# Patient Record
Sex: Male | Born: 1955 | Race: White | Hispanic: No | Marital: Married | Smoking: Current some day smoker
Health system: Southern US, Community
[De-identification: ages and names within clinical notes are randomized; demographics above are authoritative.]

## PROBLEM LIST (undated history)

## (undated) DIAGNOSIS — C61 Malignant neoplasm of prostate: Secondary | ICD-10-CM

## (undated) DIAGNOSIS — L0390 Cellulitis, unspecified: Secondary | ICD-10-CM

## (undated) DIAGNOSIS — N529 Male erectile dysfunction, unspecified: Secondary | ICD-10-CM

## (undated) DIAGNOSIS — H01009 Unspecified blepharitis unspecified eye, unspecified eyelid: Secondary | ICD-10-CM

## (undated) DIAGNOSIS — IMO0002 Reserved for concepts with insufficient information to code with codable children: Secondary | ICD-10-CM

## (undated) DIAGNOSIS — E785 Hyperlipidemia, unspecified: Secondary | ICD-10-CM

## (undated) DIAGNOSIS — G4730 Sleep apnea, unspecified: Secondary | ICD-10-CM

## (undated) HISTORY — DX: Reserved for concepts with insufficient information to code with codable children: IMO0002

## (undated) HISTORY — DX: Unspecified blepharitis unspecified eye, unspecified eyelid: H01.009

## (undated) HISTORY — DX: Sleep apnea, unspecified: G47.30

## (undated) HISTORY — DX: Cellulitis, unspecified: L03.90

## (undated) HISTORY — DX: Hyperlipidemia, unspecified: E78.5

## (undated) HISTORY — DX: Male erectile dysfunction, unspecified: N52.9

## (undated) HISTORY — PX: BACK SURGERY: SHX140

## (undated) HISTORY — DX: Malignant neoplasm of prostate: C61

## (undated) HISTORY — PX: PROSTATECTOMY: SHX69

## (undated) HISTORY — PX: TONSILLECTOMY: SHX5217

## (undated) HISTORY — PX: LUMBAR DISC SURGERY: SHX700

---

## 2000-04-11 ENCOUNTER — Encounter: Payer: Self-pay | Admitting: Family Medicine

## 2000-04-11 ENCOUNTER — Encounter: Admission: RE | Admit: 2000-04-11 | Discharge: 2000-04-11 | Payer: Self-pay | Admitting: Family Medicine

## 2000-04-29 ENCOUNTER — Ambulatory Visit (HOSPITAL_COMMUNITY): Admission: RE | Admit: 2000-04-29 | Discharge: 2000-04-30 | Payer: Self-pay | Admitting: Neurosurgery

## 2000-04-29 ENCOUNTER — Encounter: Payer: Self-pay | Admitting: Neurosurgery

## 2004-12-16 ENCOUNTER — Encounter: Payer: Self-pay | Admitting: Internal Medicine

## 2005-04-05 LAB — HM COLONOSCOPY

## 2005-12-31 ENCOUNTER — Encounter: Payer: Self-pay | Admitting: Internal Medicine

## 2007-01-17 ENCOUNTER — Encounter: Payer: Self-pay | Admitting: Internal Medicine

## 2007-02-07 ENCOUNTER — Encounter: Payer: Self-pay | Admitting: Internal Medicine

## 2007-02-15 ENCOUNTER — Encounter: Payer: Self-pay | Admitting: Internal Medicine

## 2007-02-22 ENCOUNTER — Encounter: Payer: Self-pay | Admitting: Internal Medicine

## 2007-02-27 ENCOUNTER — Encounter: Payer: Self-pay | Admitting: Internal Medicine

## 2007-02-27 ENCOUNTER — Encounter: Admission: RE | Admit: 2007-02-27 | Discharge: 2007-02-27 | Payer: Self-pay | Admitting: Urology

## 2007-02-28 ENCOUNTER — Encounter: Payer: Self-pay | Admitting: Internal Medicine

## 2007-04-04 ENCOUNTER — Encounter: Payer: Self-pay | Admitting: Internal Medicine

## 2007-04-05 ENCOUNTER — Encounter: Payer: Self-pay | Admitting: Internal Medicine

## 2007-08-11 ENCOUNTER — Ambulatory Visit: Payer: Self-pay | Admitting: Internal Medicine

## 2007-08-11 DIAGNOSIS — H0260 Xanthelasma of unspecified eye, unspecified eyelid: Secondary | ICD-10-CM | POA: Insufficient documentation

## 2007-08-11 DIAGNOSIS — E785 Hyperlipidemia, unspecified: Secondary | ICD-10-CM | POA: Insufficient documentation

## 2007-08-11 DIAGNOSIS — Z8546 Personal history of malignant neoplasm of prostate: Secondary | ICD-10-CM

## 2007-08-22 ENCOUNTER — Ambulatory Visit: Payer: Self-pay | Admitting: Internal Medicine

## 2007-08-22 LAB — CONVERTED CEMR LAB
ALT: 33 units/L (ref 0–53)
AST: 26 units/L (ref 0–37)
Albumin ELP: 62.9 % (ref 55.8–66.1)
Albumin: 4.1 g/dL (ref 3.5–5.2)
Alkaline Phosphatase: 64 units/L (ref 39–117)
Alpha-1-Globulin: 3.5 % (ref 2.9–4.9)
Alpha-2-Globulin: 8.6 % (ref 7.1–11.8)
Anti Nuclear Antibody(ANA): NEGATIVE
Basophils Absolute: 0 10*3/uL (ref 0.0–0.1)
Basophils Absolute: 0 10*3/uL (ref 0.0–0.1)
Basophils Relative: 0.7 % (ref 0.0–1.0)
Basophils Relative: 1 % (ref 0–1)
Beta Globulin: 6.3 % (ref 4.7–7.2)
Bilirubin, Direct: 0.1 mg/dL (ref 0.0–0.3)
CRP, High Sensitivity: 1 (ref 0.00–5.00)
Cholesterol: 233 mg/dL (ref 0–200)
Direct LDL: 159.1 mg/dL
Eosinophils Absolute: 0.3 10*3/uL (ref 0.0–0.7)
Eosinophils Absolute: 0.4 10*3/uL (ref 0.0–0.7)
Eosinophils Relative: 6.2 % — ABNORMAL HIGH (ref 0.0–5.0)
Eosinophils Relative: 7 % — ABNORMAL HIGH (ref 0–5)
Gamma Globulin: 13.9 % (ref 11.1–18.8)
HCT: 46.6 % (ref 39.0–52.0)
HCT: 47.5 % (ref 39.0–52.0)
HDL: 33.5 mg/dL — ABNORMAL LOW (ref >39.0)
Hemoglobin: 15.9 g/dL (ref 13.0–17.0)
Hemoglobin: 16 g/dL (ref 13.0–17.0)
LDH: 144 units/L (ref 94–250)
Lymphocytes Relative: 30.6 % (ref 12.0–46.0)
Lymphocytes Relative: 32 % (ref 12–46)
Lymphs Abs: 1.7 10*3/uL (ref 0.7–4.0)
MCHC: 33.7 g/dL (ref 30.0–36.0)
MCHC: 34.1 g/dL (ref 30.0–36.0)
MCV: 90.4 fL (ref 78.0–100.0)
MCV: 91.9 fL (ref 78.0–100.0)
Monocytes Absolute: 0.5 10*3/uL (ref 0.1–1.0)
Monocytes Absolute: 0.6 10*3/uL (ref 0.1–1.0)
Monocytes Relative: 10 % (ref 3–12)
Monocytes Relative: 9.7 % (ref 3.0–12.0)
Neutro Abs: 2.7 10*3/uL (ref 1.4–7.7)
Neutro Abs: 2.7 10*3/uL (ref 1.7–7.7)
Neutrophils Relative %: 51 % (ref 43–77)
Neutrophils Relative %: 52.8 % (ref 43.0–77.0)
Platelets: 278 10*3/uL (ref 150–400)
Platelets: 295 10*3/uL (ref 150–400)
RBC: 5.16 M/uL (ref 4.22–5.81)
RBC: 5.17 M/uL (ref 4.22–5.81)
RDW: 13 % (ref 11.5–14.6)
RDW: 14 % (ref 11.5–15.5)
Total Bilirubin: 1.1 mg/dL (ref 0.3–1.2)
Total CHOL/HDL Ratio: 7
Total Protein, Serum Electrophoresis: 7.4 g/dL (ref 6.0–8.3)
Total Protein: 7.3 g/dL (ref 6.0–8.3)
Triglycerides: 146 mg/dL (ref 0–149)
VLDL: 29 mg/dL (ref 0–40)
WBC: 5 10*3/uL (ref 4.5–10.5)
WBC: 5.4 10*3/uL (ref 4.0–10.5)

## 2007-08-28 ENCOUNTER — Encounter: Payer: Self-pay | Admitting: Internal Medicine

## 2007-10-16 ENCOUNTER — Telehealth: Payer: Self-pay | Admitting: Internal Medicine

## 2007-10-19 ENCOUNTER — Ambulatory Visit: Payer: Self-pay | Admitting: Internal Medicine

## 2007-10-19 LAB — CONVERTED CEMR LAB: PSA: 0 ng/mL — ABNORMAL LOW (ref 0.10–4.00)

## 2007-10-20 ENCOUNTER — Telehealth: Payer: Self-pay | Admitting: Internal Medicine

## 2008-01-15 ENCOUNTER — Telehealth: Payer: Self-pay | Admitting: Internal Medicine

## 2008-01-16 ENCOUNTER — Ambulatory Visit: Payer: Self-pay | Admitting: Internal Medicine

## 2008-01-16 LAB — CONVERTED CEMR LAB: PSA: 0 ng/mL — ABNORMAL LOW (ref 0.10–4.00)

## 2008-01-21 ENCOUNTER — Encounter: Payer: Self-pay | Admitting: Internal Medicine

## 2008-02-06 ENCOUNTER — Ambulatory Visit: Payer: Self-pay | Admitting: Internal Medicine

## 2008-02-06 LAB — CONVERTED CEMR LAB
ALT: 45 units/L (ref 0–53)
AST: 29 units/L (ref 0–37)
Albumin: 4.1 g/dL (ref 3.5–5.2)
Alkaline Phosphatase: 57 units/L (ref 39–117)
BUN: 10 mg/dL (ref 6–23)
Basophils Absolute: 0 10*3/uL (ref 0.0–0.1)
Basophils Relative: 0.1 % (ref 0.0–3.0)
Bilirubin Urine: NEGATIVE
Bilirubin, Direct: 0.2 mg/dL (ref 0.0–0.3)
CO2: 28 meq/L (ref 19–32)
Calcium: 8.9 mg/dL (ref 8.4–10.5)
Chloride: 105 meq/L (ref 96–112)
Cholesterol: 216 mg/dL (ref 0–200)
Creatinine, Ser: 0.7 mg/dL (ref 0.4–1.5)
Direct LDL: 142.4 mg/dL
Eosinophils Absolute: 0.2 10*3/uL (ref 0.0–0.7)
Eosinophils Relative: 4.1 % (ref 0.0–5.0)
GFR calc Af Amer: 152 mL/min
GFR calc non Af Amer: 126 mL/min
Glucose, Bld: 92 mg/dL (ref 70–99)
HCT: 44.4 % (ref 39.0–52.0)
HDL: 39.1 mg/dL (ref >39.0)
Hemoglobin, Urine: NEGATIVE
Hemoglobin: 15.5 g/dL (ref 13.0–17.0)
Ketones, ur: NEGATIVE mg/dL
Leukocytes, UA: NEGATIVE
Lymphocytes Relative: 30.4 % (ref 12.0–46.0)
MCHC: 34.8 g/dL (ref 30.0–36.0)
MCV: 92 fL (ref 78.0–100.0)
Monocytes Absolute: 0.6 10*3/uL (ref 0.1–1.0)
Monocytes Relative: 11.5 % (ref 3.0–12.0)
Neutro Abs: 3.1 10*3/uL (ref 1.4–7.7)
Neutrophils Relative %: 53.9 % (ref 43.0–77.0)
Nitrite: NEGATIVE
PSA: 0.03 ng/mL — ABNORMAL LOW (ref 0.10–4.00)
Platelets: 254 10*3/uL (ref 150–400)
Potassium: 4.2 meq/L (ref 3.5–5.1)
RBC: 4.83 M/uL (ref 4.22–5.81)
RDW: 12.2 % (ref 11.5–14.6)
Sodium: 140 meq/L (ref 135–145)
Specific Gravity, Urine: 1.015 (ref 1.000–1.03)
TSH: 0.78 microintl units/mL (ref 0.35–5.50)
Total Bilirubin: 1.1 mg/dL (ref 0.3–1.2)
Total CHOL/HDL Ratio: 5.5
Total Protein: 7.2 g/dL (ref 6.0–8.3)
Triglycerides: 134 mg/dL (ref 0–149)
Urine Glucose: NEGATIVE mg/dL
Urobilinogen, UA: 0.2 (ref 0.0–1.0)
VLDL: 27 mg/dL (ref 0–40)
WBC: 5.6 10*3/uL (ref 4.5–10.5)
pH: 7.5 (ref 5.0–8.0)

## 2008-02-08 ENCOUNTER — Ambulatory Visit: Payer: Self-pay | Admitting: Internal Medicine

## 2008-04-19 ENCOUNTER — Encounter: Payer: Self-pay | Admitting: Internal Medicine

## 2008-04-25 ENCOUNTER — Ambulatory Visit: Payer: Self-pay | Admitting: Internal Medicine

## 2008-04-25 LAB — CONVERTED CEMR LAB: PSA: 0 ng/mL — ABNORMAL LOW (ref 0.10–4.00)

## 2008-04-28 ENCOUNTER — Telehealth: Payer: Self-pay | Admitting: Internal Medicine

## 2008-05-01 ENCOUNTER — Encounter: Payer: Self-pay | Admitting: Internal Medicine

## 2008-07-24 ENCOUNTER — Ambulatory Visit: Payer: Self-pay | Admitting: Internal Medicine

## 2008-07-24 LAB — CONVERTED CEMR LAB: PSA: 0 ng/mL — ABNORMAL LOW (ref 0.10–4.00)

## 2008-07-28 ENCOUNTER — Telehealth: Payer: Self-pay | Admitting: Internal Medicine

## 2008-10-23 ENCOUNTER — Ambulatory Visit: Payer: Self-pay | Admitting: Internal Medicine

## 2008-10-30 LAB — CONVERTED CEMR LAB: PSA: 0 ng/mL — ABNORMAL LOW (ref 0.10–4.00)

## 2009-02-11 ENCOUNTER — Ambulatory Visit: Payer: Self-pay | Admitting: Internal Medicine

## 2009-02-17 ENCOUNTER — Telehealth: Payer: Self-pay | Admitting: Internal Medicine

## 2009-04-23 ENCOUNTER — Ambulatory Visit: Payer: Self-pay | Admitting: Internal Medicine

## 2009-04-23 LAB — CONVERTED CEMR LAB: PSA: 0.01 ng/mL — ABNORMAL LOW (ref 0.10–4.00)

## 2009-04-24 ENCOUNTER — Encounter: Payer: Self-pay | Admitting: Internal Medicine

## 2009-08-04 ENCOUNTER — Telehealth: Payer: Self-pay | Admitting: Internal Medicine

## 2009-10-23 ENCOUNTER — Ambulatory Visit: Payer: Self-pay | Admitting: Internal Medicine

## 2009-10-23 LAB — CONVERTED CEMR LAB: PSA: 0.01 ng/mL — ABNORMAL LOW (ref 0.10–4.00)

## 2009-10-26 ENCOUNTER — Telehealth: Payer: Self-pay | Admitting: Internal Medicine

## 2010-02-19 ENCOUNTER — Ambulatory Visit: Payer: Self-pay | Admitting: Internal Medicine

## 2010-02-19 LAB — CONVERTED CEMR LAB
ALT: 42 units/L (ref 0–53)
BUN: 13 mg/dL (ref 6–23)
Basophils Absolute: 0 10*3/uL (ref 0.0–0.1)
Chloride: 102 meq/L (ref 96–112)
Cholesterol: 226 mg/dL — ABNORMAL HIGH (ref 0–200)
Eosinophils Absolute: 0.1 10*3/uL (ref 0.0–0.7)
Glucose, Bld: 91 mg/dL (ref 70–99)
HCT: 43.9 % (ref 39.0–52.0)
Leukocytes, UA: NEGATIVE
Lymphs Abs: 1.1 10*3/uL (ref 0.7–4.0)
MCHC: 34.4 g/dL (ref 30.0–36.0)
MCV: 95.9 fL (ref 78.0–100.0)
Monocytes Absolute: 0.7 10*3/uL (ref 0.1–1.0)
Nitrite: NEGATIVE
Platelets: 265 10*3/uL (ref 150.0–400.0)
Potassium: 4.9 meq/L (ref 3.5–5.1)
RDW: 13.8 % (ref 11.5–14.6)
Specific Gravity, Urine: >=1.03 (ref 1.000–1.030)
TSH: 0.56 microintl units/mL (ref 0.35–5.50)
Total Bilirubin: 1.4 mg/dL — ABNORMAL HIGH (ref 0.3–1.2)
pH: 6 (ref 5.0–8.0)

## 2010-02-23 ENCOUNTER — Encounter: Payer: Self-pay | Admitting: Internal Medicine

## 2010-02-23 ENCOUNTER — Ambulatory Visit: Payer: Self-pay | Admitting: Internal Medicine

## 2010-03-30 ENCOUNTER — Ambulatory Visit: Payer: Self-pay | Admitting: Internal Medicine

## 2010-04-27 ENCOUNTER — Encounter: Payer: Self-pay | Admitting: Chiropractic Medicine

## 2010-05-05 NOTE — Progress Notes (Signed)
  Phone Note Outgoing Call   Reason for Call: Discuss lab or test results Summary of Call: please call patient: PSA 0.01 - virtually undetectable  Thanks Initial call taken by: Jacques Navy MD,  October 26, 2009 11:31 AM  Follow-up for Phone Call        informed pt and will fax results to him Follow-up by: Ami Bullins CMA,  October 27, 2009 10:18 AM

## 2010-05-05 NOTE — Assessment & Plan Note (Signed)
Summary: CPX/NWS  #   Vital Signs:  Patient profile:   55 year old male Height:      73 inches Weight:      231 pounds BMI:     30.59 O2 Sat:      97 % on Room air Temp:     98.7 degrees F oral Pulse rate:   69 / minute BP sitting:   118 / 74  (left arm) Cuff size:   large  Vitals Entered By: Bill Salinas CMA (February 23, 2010 1:38 PM)  O2 Flow:  Room air CC: pt here for cpx/ ab Comments Pt states he never started lovastatin/ ab   Primary Care Provider:  Norins  CC:  pt here for cpx/ ab.  History of Present Illness: In the interval since his last visit in '09 has been unremarkable.  He is s/p prostatectomy and has a problem with complete and durable tumescence. He does use cialis to help with erectile function. Had robotic prostatectomy at [facility], Dec '08. (Dr.Polascik)  Otherwise feels pretty healthy  Current Medications (verified): 1)  Zyrtec Allergy 10 Mg  Tabs (Cetirizine Hcl) .... Take 1 Tablet By Mouth Once A Day As Needed 2)  Cialis 10 Mg  Tabs (Tadalafil) .... As Needed 3)  Patanol 0.1 %  Soln (Olopatadine Hcl) .Marland Kitchen.. 1 Qtt Ou Two Times A Day As Needed 4)  Trazodone Hcl 100 Mg Tabs (Trazodone Hcl) .... Take 1 Tab By Mouth At Bedtime 5)  Lovastatin 20 Mg Tabs (Lovastatin) .Marland Kitchen.. 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 marriage in good health.  Review of Systems       The patient complains of weight loss.  The patient denies anorexia, fever, weight gain, decreased hearing, chest pain, syncope, dyspnea on exertion, prolonged cough, headaches, hemoptysis, abdominal pain, melena, hematochezia, severe indigestion/heartburn, incontinence, muscle weakness, suspicious skin lesions, transient blindness, difficulty walking, unusual weight change, abnormal bleeding, and angioedema.    Physical Exam  General:  tall - large framed white male in no distress Head:  Normocephalic and atraumatic without obvious abnormalities. No apparent alopecia or balding. Eyes:  No corneal or conjunctival inflammation noted. EOMI. Perrla. Funduscopic exam benign, without hemorrhages, exudates or papilledema. Vision grossly normal. Ears:  External ear exam shows no significant lesions or deformities.  Otoscopic examination reveals clear canals, tympanic membranes are intact bilaterally without bulging, retraction, inflammation or discharge. Hearing is grossly normal bilaterally. Nose:  no external deformity and no external erythema.   Mouth:  Oral mucosa and oropharynx without lesions or exudates.  Teeth in good repair. Neck:  supple, no masses, no thyromegaly, and no carotid bruits.   Chest Wall:  No deformities, masses, tenderness or gynecomastia noted. Lungs:  Normal respiratory effort, chest expands symmetrically. Lungs are clear to auscultation, no crackles or wheezes. Heart:  Normal  rate and regular rhythm. S1 and S2 normal without gallop, murmur, click, rub or other extra sounds. Abdomen:  soft, normal bowel sounds, no distention, no guarding, and no hepatomegaly.   Prostate:  deferred to prostatectomy Msk:  normal ROM, no joint tenderness, no joint swelling, no joint warmth, no redness over joints, no joint deformities, and no joint instability.   Pulses:   2+ radial and DP and PT pulses bilaterally Extremities:  No clubbing, cyanosis, edema, or deformity noted with normal full range of motion of all joints.   Neurologic:  alert & oriented X3, cranial nerves II-XII intact, strength normal in all extremities, sensation intact to light touch, gait normal, and DTRs symmetrical and normal.   Skin:  turgor normal, color normal, no rashes, no suspicious lesions, and no petechiae.   Cervical Nodes:  no anterior cervical adenopathy and no posterior cervical adenopathy.   Inguinal Nodes:  no R inguinal adenopathy and no L inguinal adenopathy.   Psych:  Oriented X3, memory intact for recent and remote, normally interactive, good eye contact, and not anxious appearing.     Impression & Recommendations:  Problem # 1:  NEOPLASM, MALIGNANT, PROSTATE, S/P PROSTATECTOMY, RAD (ICD-V10.46) Doing very well with undetectable PSA. He does have some problems with ED with fairly good results with cialis. Discussed the options of cavernous injections or penile prosthesis.  Plan - will continue with cialis at this time.  Problem # 2:  HYPERLIPIDEMIA (ICD-272.4) Never started statin medication. Since his last lab the LDL has risen from the mid 140's to 169. Reviewed with him the NCEP guidelines, i.e. goal of LDL 130 or less in a low to moderate risk patient. Framingham 10 year cardiac risk calculated to be 8% risk over 10 years of having a cardiac event.  Plan - life style management: low fat diet and aerobic exercise          recheck lipid panel in 6 months: if LDL not well below 160, preferrably at goal of 130 or less, will recommend medical therapy  His updated medication list for this problem includes:    Lovastatin 20 Mg Tabs (Lovastatin) .Marland Kitchen... 1 by mouth qpm  Problem # 3:  Preventive Health Care (ICD-V70.0) Interval history is negative. Physical exam is unremarkable. Lab results are within normal limits except for lipids. He is a candidate for colorectal cancer  screening - colonoscopy. Will schedule when he contacts the office with the go-ahead. He believes he is current with tetnus immunization. 12 lead EKG without evidence of ischemia  In summary- a very nice man who is medically stable except for lipid management. He will contact the office when he wants to schedule colonoscopy. He will otherwise return in 6 months for lipid panel.  Complete Medication List: 1)  Zyrtec Allergy 10 Mg Tabs (Cetirizine hcl) .... Take 1 tablet by mouth once a day as needed 2)  Cialis 10 Mg Tabs (Tadalafil) .... As needed 3)  Patanol 0.1 % Soln (Olopatadine hcl) .Marland Kitchen.. 1 qtt ou two times a day as needed 4)  Trazodone Hcl 100 Mg Tabs (Trazodone hcl) .... Take 1 tab by mouth at bedtime 5)  Lovastatin 20 Mg Tabs (Lovastatin) .Marland Kitchen.. 1 by mouth qpm  Patient: Donald [name] Note: All result statuses are Final unless otherwise noted.  Tests: (1) Lipid Panel (LIPID)   Cholesterol          [H]  226 mg/dL  0-200     ATP III Classification            Desirable:  < 200 mg/dL                    Borderline High:  200 - 239 mg/dL               High:  > = 240 mg/dL   Triglycerides             69.0 mg/dL                  1.4-782.9     Normal:  <150 mg/dL     Borderline High:  562 - 199 mg/dL   HDL                       13.08 mg/dL                 >65.78   VLDL Cholesterol          13.8 mg/dL                  4.6-96.2  CHO/HDL Ratio:  CHD Risk                             5                    Men          Women     1/2 Average Risk     3.4          3.3     Average Risk          5.0          4.4     2X Average Risk          9.6          7.1     3X Average Risk          15.0          11.0                           Tests: (2) BMP (METABOL)   Sodium                    139 mEq/L                   135-145   Potassium                 4.9 mEq/L                   3.5-5.1   Chloride                  102 mEq/L                   96-112   Carbon Dioxide            28 mEq/L                     19-32   Glucose                   91 mg/dL  70-99   BUN                       13 mg/dL                    6-01   Creatinine                0.8 mg/dL                   0.9-3.2   Calcium                   8.8 mg/dL                   3.5-57.3   GFR                       103.85 mL/min               >60  Tests: (3) CBC Platelet w/Diff (CBCD)   White Cell Count          6.2 K/uL                    4.5-10.5   Red Cell Count            4.58 Mil/uL                 4.22-5.81   Hemoglobin                15.1 g/dL                   22.0-25.4   Hematocrit                43.9 %                      39.0-52.0   MCV                       95.9 fl                     78.0-100.0   MCHC                      34.4 g/dL                   27.0-62.3   RDW                       13.8 %                      11.5-14.6   Platelet Count            265.0 K/uL                  150.0-400.0   Neutrophil %              68.7 %                      43.0-77.0   Lymphocyte %              18.3 %                      12.0-46.0   Monocyte %  10.8 %                      3.0-12.0   Eosinophils%              1.9 %                       0.0-5.0   Basophils %               0.3 %                       0.0-3.0   Neutrophill Absolute      4.3 K/uL                    1.4-7.7   Lymphocyte Absolute       1.1 K/uL                    0.7-4.0   Monocyte Absolute         0.7 K/uL                    0.1-1.0  Eosinophils, Absolute                             0.1 K/uL                    0.0-0.7   Basophils Absolute        0.0 K/uL                    0.0-0.1  Tests: (4) Hepatic/Liver Function Panel (HEPATIC)   Total Bilirubin      [H]  1.4 mg/dL                   3.6-6.4   Direct Bilirubin          0.2 mg/dL                   4.0-3.4   Alkaline Phosphatase      55 U/L                      39-117   AST                  [H]  49 U/L                      0-37   ALT                       42 U/L                       0-53   Total Protein             6.9 g/dL                    7.4-2.5   Albumin                   4.3 g/dL                    9.5-6.3  Tests: (5) TSH (TSH)   FastTSH  0.56 uIU/mL                 0.35-5.50  Tests: (6) UDip w/Micro (URINE)   Color                     YELLOW       RANGE:  Yellow;Lt. Yellow   Clarity                   CLEAR                       Clear   Specific Gravity          >=1.030                     1.000 - 1.030   Urine Ph                  6.0                         5.0-8.0   Protein                   TRACE                       Negative   Urine Glucose             NEGATIVE                    Negative   Ketones                   >=80                        Negative   Urine Bilirubin           SMALL                       Negative   Blood                     NEGATIVE                    Negative   Urobilinogen              0.2                         0.0 - 1.0   Leukocyte Esterace        NEGATIVE                    Negative   Nitrite                   NEGATIVE                    Negative   Urine WBC                 0-2/hpf                     0-2/hpf   Urine Mucus               Presence of                 None   Urine Epith  Rare(0-4/hpf)               Rare(0-4/hpf)  Tests: (7) Prostate Specific Antigen (PSA)   PSA-Hyb              [L]  0.00 ng/mL                  0.10-4.00  Tests: (8) Cholesterol LDL - Direct (DIRLDL)  Cholesterol LDL - Direct                             169.5 mg/dL     Optimal:  <213 mg/dL     Near or Above Optimal:  100-129 mg/dL     Borderline High:  086-578 mg/dL     High:  469-629 mg/dL     Very High:  >528 mg/dL  Orders Added: 1)  Est. Patient 40-64 years [[id number]]

## 2010-05-05 NOTE — Letter (Signed)
   [facility] [address] [place], [place]  [address] Phone: [phone]      April 24, 2009   HAYS DUNNIGAN [address] [place], [place] [address]  RE:  LAB RESULTS  Dear  Mr. Martindelcampo,  The following is an interpretation of your most recent lab tests.  Please take note of any instructions provided or changes to medications that have resulted from your lab work.  PSA:  normal - no follow-up needed PSA: 0.01     Sincerely Yours,    Jacques Navy MD

## 2010-05-05 NOTE — Progress Notes (Signed)
Summary: REFILL _ Trazodone  Phone Note Refill Request Message from:  Pharmacy  Refills Requested: Medication #1:  TRAZODONE HCL 100 MG TABS Take 1 tab by mouth at bedtime Initial call taken by: Lamar Sprinkles, CMA,  Aug 04, 2009 10:03 AM  Follow-up for Phone Call        ok for refill #30 x 12 Follow-up by: Jacques Navy MD,  Aug 04, 2009 12:49 PM    Prescriptions: TRAZODONE HCL 100 MG TABS (TRAZODONE HCL) Take 1 tab by mouth at bedtime  #30 x 12   Entered by:   Lamar Sprinkles, CMA   Authorized by:   Jacques Navy MD   Signed by:   Lamar Sprinkles, CMA on 08/04/2009   Method used:   Faxed to ...       [facility] (retail)       [address] [place]       [address]       [place], [place]  [address]       Ph: [phone]       Fax: [phone]   RxID:   [id number]

## 2010-08-21 NOTE — H&P (Signed)
[name]. [facility]  Patient:    Donald [name], Donald [name]                      MRN: [id number] Adm. Date:  [id number] Attending:  Josie [name]                         History and Physical  REASON FOR ADMISSION:  Herniated lumbar disk.  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 Caryn Bee C. [name] for neurosurgical consultation for a herniated lumbar disk.  I initially saw him in the office on April 22, 2000.  The patient notes severe right leg pain.  He says that he has had this for two months and that it is getting worse.  He notes pain in his shin, occasionally into his foot and more into the top of his foot than the side of his foot, although not affecting the toes on the right foot.  He denies any left leg pain.  He denies any weakness.  He says that he is in severe pain and that at times, he has been close to tears because of the severity of the pain and the fact that he cannot work, is not able to concentrate and often times has not been able to sleep.  He notes pain in his right buttock.  He denies any left lower extremity pain or any bowel or bladder dysfunction.  He took a 12-day prednisone taper which he said did not help him.  He has taken hydrocodone on an occasional basis for severe pain. He notes that this started in November.  He does not recall an inciting event, although he has had a previous episode of sciatica on the right earlier in the summer.  He says he has been working out at [employer] and has done some heavy weight-lifting and thinks this may have been when he developed his pain complaints.  REVIEW OF SYSTEMS:  Detailed review of systems sheet was reviewed with the patient; pertinent positive include:  CARDIOVASCULAR:  He notes a cholesterol of 230.  MUSCULOSKELETAL:  He notes leg pain.  All other systems negative.  PAST MEDICAL HISTORY Current medical conditions:   Unremarkable.  Prior operations and hospitalizations:  Tonsillectomy at age 32.  Laser and cataract surgery, April 05, 1997.  MEDICATIONS:  He takes codeine or hydrocodone for pain.  Sometimes, he takes Motrin for pain and he takes Claritin on an occasional basis.  ALLERGIES:  He has no known drug allergies.  HEIGHT AND WEIGHT:  He is 6-feet 2-inches tall, 220 pounds.  FAMILY HISTORY:  Mother died at age 82; father died at age 60 of stroke. There is a family history of strokes and heart disease.  SOCIAL HISTORY:  He is a nonsmoker.  He drinks a couple of beers daily.  He has no history of substance abuse.  DIAGNOSTIC STUDIES:  The patient presents with an MRI of the lumbar spine, which was obtained at [facility] on April 21, 2000, which shows a right paracentral disk herniation at L4-5 with caudal extension of a fragment on the right.  PHYSICAL EXAMINATION  GENERAL APPEARANCE:  The patient is uncomfortable-appearing, relatively fit white male.  HEENT:  Normocephalic, atraumatic.  The pupils are equal, round and reactive to light.  Extraocular muscles are intact.  Sclerae are white.  Conjunctivae pink.  Oropharynx benign.  Uvula midline.  NECK:  There are no masses, meningismus, deformities, tracheal deviation, jugular vein distention or carotid bruits.  There is normal cervical range of motion.  Spurlings test is negative without reproducible radicular pain, turning the patients head to either side.  Lhermittes sign is not present with axial compression.  RESPIRATORY:  There is normal respiratory effort with good intercostal function.  Lungs are clear to auscultation.  There are no rales, rhonchi or wheezes.  CARDIOVASCULAR:  The heart had regular rate and rhythm to auscultation.  No murmurs are appreciated.  There is no extremity edema, clubbing or cyanosis. There are palpable pedal pulses.  ABDOMEN:  Soft, nontender.  No hepatosplenomegaly appreciated or masses. There are  active bowel sounds.  No guarding or rebound.  MUSCULOSKELETAL:  He walks with an antalgic gait favoring his right lower extremity.  He has minimal back pain to palpation, mild right sciatic notch discomfort to palpation.  He is only able to bend to the level of his knees before he gets significant pain.  He is able to walk on his toes and has an antalgic gait favoring his right lower extremity.  He is not able to walk on his heel on the right.  Straight leg raise is mildly positive on the right, negative on the left.  Negative Patricks test bilaterally.  NEUROLOGIC:  The patient is oriented to time, person and place and has good recall of both recent and remote memory, with normal attention span and concentration.  The patients speaks with clear and fluent speech and exhibits normal language function and appropriate fund of knowledge.  Cranial nerves examination:  Pupils are equal, round and reactive to light.  Extraocular movements are full.  Visual fields are full to confrontational testing. Facial sensation and facial motor are intact and symmetric.  Hearing is intact to finger rub.  Palate is upgoing.  Shoulder shrugs are symmetric.  Tongue protrudes in the midline.  Motor examination:  Motor strength is 5/5 in bilateral deltoids, biceps, triceps, hand grips, wrist extensors and interossei.  In the lower extremities, motor strength is 5/5 in hip flexion and extension, quadriceps, hamstrings, plantarflexion, dorsiflexion and in the left EHL strength; on the right, EHL strength is 4/5.  He has right gluteus strength at 4/5.  Deep tendon reflexes are 2 in the biceps, triceps and brachioradialis, 3 at the knees and 2 at the ankles.  Great toes are downgoing to plantar stimulation.  Cerebellar examination:  Normal coordination in the upper and lower extremities and normal rapid alternating movements.  Romberg test is negative.  IMPRESSION AND RECOMMENDATIONS:  The patient is a  55 year old man with a free-fragment disk herniation at L4-5 on the right.  He has an L5 radiculopathy with extensor hallucis longus and gluteus weakness.  Because of  the significant severity of his pain and the long-standing nature of his complaints and the fact that he is not getting better, despite the steroid taper, exercise and rest along with narcotic analgesic, I have recommended to him that he go ahead with surgical intervention.  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  I reviewed the studies with the patient and went over his physical examination.  I reviewed surgical models and discussed the typical hospital course and operative and postoperative course and  the potential risks and benefits of surgery.  The risks of surgery were discussed in detail and include, but are not limited to, the risks of anesthesia, blood loss, the possibility of hemorrhage, infection, damage to nerves, damage to blood vessels, injury to the lumbar nerve roots causing either temporary or permanent leg pain, numbness and/or weakness.  There is a potential for spinal fluid leak from dural tear.  There is a potential for post-laminectomy spondylolisthesis, recurrent disk rupture quoted at approximately 10%, failure to relieve pain, worsening of pain or need for further surgery. DD:  04/29/00 TD:  04/29/00 Job: [id number]

## 2010-08-21 NOTE — Op Note (Signed)
[place]. [facility]  Patient:    Donald [name], Donald [name]                      MRN: [id number] Proc. Date: 04/29/00 Adm. Date:  [id number] Attending:  Josie Saunders                           Operative Report  PREOPERATIVE DIAGNOSIS:  Herniated lumbar disc, lumbar radiculopathy, spondylosis, degenerative disc disease.  POSTOPERATIVE DIAGNOSIS:  Herniated lumbar disc, lumbar radiculopathy, spondylosis, degenerative disc disease.  OPERATION:  Hemisemilaminectomy and microdiskectomy at L4-5 right with microdiskectomy and microdissection.  SURGEON:  Danae Orleans. Venetia Maxon, M.D.  ASSISTANTMarland Kitchen  Mena Goes. Franky Macho, M.D.  ANESTHESIA:  General endotracheal  ESTIMATED BLOOD LOSS:  50 cc  COMPLICATIONS:  None.  DISPOSITION:  Recovery  INDICATIONS:  Donald [name] is a 55 year old man with an L5 radiculopathy on the right with a small free fragment disc herniation.  It was elected to take him to surgery for microdiskectomy.  Preoperatively he had gluteus and EHL weakness.  DESCRIPTION OF PROCEDURE:  Donald [name] is brought to the operating room. Following satisfactory and uncomplicated induction of general endotracheal anesthesia and placement of intravenous lines, he was placed in a prone position on the Wilson frame.  His low back was shaved and prepped and draped in the usual sterile fashion.  The area of planned incision was infiltrated with 0.25% Marcaine and 0.5% Lidocaine and 1:200,000 epinephrine.  Incision was made in the midline overlying the L4-5 interspace, carried through adipose tissue to the lumbar dorsal fascia which was incised at the right of the midline with electrocautery.  Subperiosteal dissection was performed exposing the L4-5 interspace on the right.  An intraoperative x-ray was performed confirming correct level.  Using the drill and an AM8 equivalent bit, a hemisemilaminectomy of L4 was performed.  This was completed with Kerrison rongeurs.  A  foraminotomy was performed with removal of the cephalad lip of L5 and overlying the L5 nerve root.  Ligamentum flavum was removed in a piece meal fashion.  Microscope is brought into the field.  Using microdissection technique, the edge of the thecal sac was identified.  The L5 nerve root was identified and had an abnormally low take off from the thecal sac and extended out the neuroforamin.  There was a herniated disc directly beneath the L5 nerve root.  This was contained in a subligamentous fashion.  The posterior longitudinal ligament was incised with #11 blade and using microdissection technique several fragments of disc material were removed.  The nerve root was decompressed and its course was palpated both above the nerve root and underneath the nerve root. There appeared to be no residual compression. Hemostasis was assured with Gelfoam soaked in Thrombin and bipolar electrocautery.  The wound was then copiously irrigated with Bacitracin saline.  The self retaining retractor was removed.  Microscope was taken out of the field.  The lumbar dorsal fascia was closed with 0 Vicryl sutures.  The subcutaneous tissue was reapproximated with 2-0 Vicryl interrupted inverted sutures.  Skin edges were reapproximated with interrupted 3-0 Vicryl subcuticular stitch.  The wound was dressed with Benzoin, Steri-Strips, Telfa gauze and tape.  The patient was extubated in the operating room and taken to the recovery room in stable and satisfactory condition having tolerated his operation well.  Counts were correct at the end of the case. DD:  04/29/00 TD:  04/30/00 Job:  [id number]

## 2010-09-23 ENCOUNTER — Other Ambulatory Visit (INDEPENDENT_AMBULATORY_CARE_PROVIDER_SITE_OTHER): Payer: Self-pay

## 2010-09-23 ENCOUNTER — Other Ambulatory Visit: Payer: Self-pay | Admitting: *Deleted

## 2010-09-23 ENCOUNTER — Other Ambulatory Visit: Payer: Self-pay | Admitting: Internal Medicine

## 2010-09-23 DIAGNOSIS — E785 Hyperlipidemia, unspecified: Secondary | ICD-10-CM

## 2010-09-23 LAB — LIPID PANEL
Cholesterol: 258 mg/dL — ABNORMAL HIGH (ref 0–200)
HDL: 48.6 mg/dL (ref >39.00)
Total CHOL/HDL Ratio: 5
Triglycerides: 97 mg/dL (ref 0.0–149.0)
VLDL: 19.4 mg/dL (ref 0.0–40.0)

## 2010-09-23 LAB — LDL CHOLESTEROL, DIRECT: Direct LDL: 182.6 mg/dL

## 2010-09-24 ENCOUNTER — Encounter: Payer: Self-pay | Admitting: Internal Medicine

## 2010-09-29 ENCOUNTER — Encounter: Payer: Self-pay | Admitting: Internal Medicine

## 2010-09-30 ENCOUNTER — Encounter: Payer: Self-pay | Admitting: Internal Medicine

## 2010-10-01 ENCOUNTER — Ambulatory Visit (INDEPENDENT_AMBULATORY_CARE_PROVIDER_SITE_OTHER): Payer: BC Managed Care – PPO | Admitting: Internal Medicine

## 2010-10-01 DIAGNOSIS — E785 Hyperlipidemia, unspecified: Secondary | ICD-10-CM

## 2010-10-01 DIAGNOSIS — Z8546 Personal history of malignant neoplasm of prostate: Secondary | ICD-10-CM

## 2010-10-01 MED ORDER — LOVASTATIN 20 MG PO TABS: 20.0000 mg | ORAL_TABLET | Freq: Every day | ORAL | Status: DC

## 2010-10-03 NOTE — Assessment & Plan Note (Signed)
Rising  Lipid values with an LDL that is above the treatment threshold, NCEP ATP III. Reviewed mechanism of medication with him and encouraged trial of statin drugs.  Plan = trial of lovastatin 20 mg daily            Follow-up lab in three weeks.

## 2010-10-03 NOTE — Assessment & Plan Note (Signed)
Patinetis concerned for low T and how it may affect him. Shared the findings about lack of safety data for men s/p radical prostatectomy.  Plan - testosterone level with recommendations to follow

## 2010-10-03 NOTE — Progress Notes (Signed)
  Subjective:    Patient ID: ARIANNA DELSANTO, male    DOB: 1955/09/03, 55 y.o.   MRN: [id number]  HPI Mr. Fuchs presents to discuss his elevated LDL cholesterol,  Lab Results  Component Value Date   CHOL 258* 09/23/2010   CHOL 226* 02/19/2010   CHOL 216* 02/06/2008   Lab Results  Component Value Date   HDL 48.60 09/23/2010   HDL 29.51 02/19/2010   HDL 39.1 02/06/2008   No results found for this basename: [facility]   Lab Results  Component Value Date   TRIG 97.0 09/23/2010   TRIG 69.0 02/19/2010   TRIG 134 02/06/2008   Lab Results  Component Value Date   CHOLHDL 5 09/23/2010   CHOLHDL 5 02/19/2010   CHOLHDL 5.5 CALC 02/06/2008   Lab Results  Component Value Date   LDLDIRECT 182.6 09/23/2010   LDLDIRECT 169.5 02/19/2010   LDLDIRECT 142.4 02/06/2008   He is very concerned about adverse effect potential of "statin" drugs.  He is s/p radical prostatectomy 4 years ago, He has been doing well with undetectable PSA. He is interested in testosterone replacement if he is hypogonadic. Rapid literature review reveals that there is no data in regard to safety of replacement.   PMH, FamHx and SocHx reviewed for any changes and relevance.    Review of Systems No compalints re: 5 major systems    Objective:   Physical Exam Vital sings reviewed Gen"l - WNWD fit appearing white man Pul - normal respirations Cor - rrr       Assessment & Plan:

## 2010-10-22 ENCOUNTER — Other Ambulatory Visit (INDEPENDENT_AMBULATORY_CARE_PROVIDER_SITE_OTHER): Payer: BC Managed Care – PPO

## 2010-10-22 DIAGNOSIS — E785 Hyperlipidemia, unspecified: Secondary | ICD-10-CM

## 2010-10-22 DIAGNOSIS — Z8546 Personal history of malignant neoplasm of prostate: Secondary | ICD-10-CM

## 2010-10-22 LAB — HEPATIC FUNCTION PANEL
AST: 28 U/L (ref 0–37)
Albumin: 4.5 g/dL (ref 3.5–5.2)
Total Bilirubin: 1.4 mg/dL — ABNORMAL HIGH (ref 0.3–1.2)

## 2010-10-22 LAB — LIPID PANEL
HDL: 60.6 mg/dL (ref >39.00)
LDL Cholesterol: 125 mg/dL — ABNORMAL HIGH (ref 0–99)
Total CHOL/HDL Ratio: 3
Triglycerides: 60 mg/dL (ref 0.0–149.0)
VLDL: 12 mg/dL (ref 0.0–40.0)

## 2010-10-23 LAB — TESTOSTERONE, FREE, TOTAL, SHBG
Sex Hormone Binding: 43 nmol/L (ref 13–71)
Testosterone-% Free: 1.8 % (ref 1.6–2.9)
Testosterone: 489.12 ng/dL (ref 250–890)

## 2010-10-28 ENCOUNTER — Encounter: Payer: Self-pay | Admitting: Internal Medicine

## 2011-10-06 ENCOUNTER — Other Ambulatory Visit (INDEPENDENT_AMBULATORY_CARE_PROVIDER_SITE_OTHER): Payer: BC Managed Care – PPO

## 2011-10-06 ENCOUNTER — Ambulatory Visit (INDEPENDENT_AMBULATORY_CARE_PROVIDER_SITE_OTHER): Payer: BC Managed Care – PPO | Admitting: Internal Medicine

## 2011-10-06 ENCOUNTER — Encounter: Payer: Self-pay | Admitting: Internal Medicine

## 2011-10-06 VITALS — BP 108/70 | HR 64 | Temp 98.1°F | Ht 74.0 in | Wt 223.0 lb

## 2011-10-06 DIAGNOSIS — E785 Hyperlipidemia, unspecified: Secondary | ICD-10-CM

## 2011-10-06 DIAGNOSIS — Z8546 Personal history of malignant neoplasm of prostate: Secondary | ICD-10-CM

## 2011-10-06 DIAGNOSIS — N529 Male erectile dysfunction, unspecified: Secondary | ICD-10-CM

## 2011-10-06 DIAGNOSIS — Z23 Encounter for immunization: Secondary | ICD-10-CM

## 2011-10-06 DIAGNOSIS — Z0000 Encounter for general adult medical examination without abnormal findings: Secondary | ICD-10-CM

## 2011-10-06 LAB — COMPREHENSIVE METABOLIC PANEL
ALT: 34 U/L (ref 0–53)
AST: 29 U/L (ref 0–37)
Chloride: 101 mEq/L (ref 96–112)
Creatinine, Ser: 0.8 mg/dL (ref 0.4–1.5)
Sodium: 138 mEq/L (ref 135–145)
Total Bilirubin: 1.2 mg/dL (ref 0.3–1.2)

## 2011-10-06 LAB — LIPID PANEL
Cholesterol: 216 mg/dL — ABNORMAL HIGH (ref 0–200)
Total CHOL/HDL Ratio: 4
Triglycerides: 162 mg/dL — ABNORMAL HIGH (ref 0.0–149.0)
VLDL: 32.4 mg/dL (ref 0.0–40.0)

## 2011-10-06 LAB — HEPATIC FUNCTION PANEL
AST: 29 U/L (ref 0–37)
Albumin: 4.5 g/dL (ref 3.5–5.2)
Alkaline Phosphatase: 54 U/L (ref 39–117)
Total Protein: 7.4 g/dL (ref 6.0–8.3)

## 2011-10-06 NOTE — Progress Notes (Signed)
Subjective:    Patient ID: Donald [name], male    DOB: [birth year], 56 y.o.   MRN: [id number]  HPI Donald [name] presents for generalized wellness exam. He has started on lovastatin several months ago but has not had follow-up lab. When first started on lovastatin he had a good response to therapy. However, he discontinued medication for several months before restarting.He did not have any documented adverse effects with the medication.   He has otherwise been doing well. He was originally followed at [facility] after robotic assisted prostatectomy but his physician has moved on. He has not reestablished with a local urologist. He does have ED.  Past Medical History  Diagnosis Date  . HNP (herniated nucleus pulposus)     lumbar spine  . Hyperlipidemia     mild  . Prostate cancer    Past Surgical History  Procedure Date  . Tonsillectomy   . Prostatectomy     robotic '08 at [facility]   Family History  Problem Relation Age of Onset  . Diabetes Paternal Grandmother   . Cancer Brother     prostate 08  . Stroke Father   . Other Father     tobacco abuse  . Alzheimer's disease Mother   . Heart attack Sister     fatal  . Coronary artery disease Sister   . Obesity Sister   . Other Sister     tobacco abuse   History   Social History  . Marital Status: Married    Spouse Name: N/A    Number of Children: N/A  . Years of Education: N/A   Occupational History  . cpa    Social History Main Topics  . Smoking status: Never Smoker   . Smokeless tobacco: Never Used  . Alcohol Use: 3.0 oz/week    6 drink(s) per week  . Drug Use: No  . Sexually Active: Yes -- Male partner(s)   Other Topics Concern  . Not on file   Social History Narrative   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 Oldest step dtr died of suicide. WU:JWJXB with him in his business. Marriage in good health.   Current  Outpatient Prescriptions on File Prior to Visit  Medication Sig Dispense Refill  . cetirizine (ZYRTEC) 10 MG tablet Take 10 mg by mouth daily as needed.        . lovastatin (MEVACOR) 20 MG tablet Take 1 tablet (20 mg total) by mouth at bedtime.  30 tablet  11  . olopatadine (PATANOL) 0.1 % ophthalmic solution Place 1 drop into both eyes 2 (two) times daily as needed.        . tadalafil (CIALIS) 10 MG tablet Take 10 mg by mouth as needed.        . traZODone (DESYREL) 100 MG tablet Take 100 mg by mouth at bedtime.            Review of Systems Constitutional:  Negative for fever, chills, activity change and unexpected weight change.  HEENT:  Negative for hearing loss, ear pain, congestion, neck stiffness and postnasal drip. Negative for sore throat or swallowing problems. Negative for dental complaints.   Eyes: Negative for vision loss or change in visual acuity.  Respiratory: Negative for chest tightness and wheezing. Negative for DOE.   Cardiovascular: Negative for chest pain. Positive palpitations- rapid heart rate one time after exercise with dizziness, persisted for a couple  of hours. No decreased exercise tolerance Gastrointestinal: No change in bowel habit. No bloating or gas. No reflux or indigestion Genitourinary: Negative for urgency, frequency, flank pain and difficulty urinating.  Musculoskeletal: Negative for myalgias, back pain, arthralgias and gait problem. Has had pain in the right achilles tendon - year.  Neurological: Negative for dizziness, tremors, weakness and headaches.  Hematological: Negative for adenopathy.  Psychiatric/Behavioral: Negative for behavioral problems and dysphoric mood.       Objective:   Physical Exam Filed Vitals:   10/06/11 1438  BP: 108/70  Pulse: 64  Temp: 98.1 F (36.7 C)   Gen'l: Well nourished well developed     male in no acute distress  HEENT: Head: Normocephalic and atraumatic. Right Ear: External ear normal. EAC/TM nl. Left Ear:  External ear normal.  EAC/TM nl. Nose: Nose normal. Mouth/Throat: Oropharynx is clear and moist. Dentition - native, in good repair. No buccal or palatal lesions. Posterior pharynx clear. Eyes: Conjunctivae and sclera clear. EOM intact. Pupils are equal, round, and reactive to light. Right eye exhibits no discharge. Left eye exhibits no discharge. Neck: Normal range of motion. Neck supple. No JVD present. No tracheal deviation present. No thyromegaly present.  Cardiovascular: Normal rate, regular rhythm, no gallop, no friction rub, no murmur heard.      Quiet precordium. 2+ radial and DP pulses . No carotid bruits Pulmonary/Chest: Effort normal. No respiratory distress or increased WOB, no wheezes, no rales. No chest wall deformity or CVAT. Abdominal: Soft. Bowel sounds are normal in all quadrants. He exhibits no distension, no tenderness, no rebound or guarding, No heptosplenomegaly  Genitourinary:   Musculoskeletal: Normal range of motion. He exhibits no edema and no tenderness.       Small and large joints without redness, synovial thickening or deformity. Full range of motion preserved about all small, median and large joints.  Lymphadenopathy:    He has no cervical or supraclavicular adenopathy.  Neurological: He is alert and oriented to person, place, and time. CN II-XII intact. DTRs 2+ and symmetrical biceps, radial and patellar tendons. Cerebellar function normal with no tremor, rigidity, normal gait and station.  Skin: Skin is warm and dry. No rash noted. No erythema.  Psychiatric: He has a normal mood and affect. His behavior is normal. Thought content normal.   Lab Results  Component Value Date   WBC 6.2 02/19/2010   HGB 15.1 02/19/2010   HCT 43.9 02/19/2010   PLT 265.0 02/19/2010   GLUCOSE 98 10/06/2011   CHOL 216* 10/06/2011   TRIG 162.0* 10/06/2011   HDL 50.30 10/06/2011   LDLDIRECT 130.0 10/06/2011   LDLCALC 125* 10/22/2010        ALT 34 10/06/2011   AST 29 10/06/2011        NA 138  10/06/2011   K 4.1 10/06/2011   CL 101 10/06/2011   CREATININE 0.8 10/06/2011   BUN 10 10/06/2011   CO2 28 10/06/2011   TSH 0.56 02/19/2010   PSA 0.00* 10/06/2011         Assessment & Plan:

## 2011-10-08 DIAGNOSIS — N529 Male erectile dysfunction, unspecified: Secondary | ICD-10-CM

## 2011-10-08 DIAGNOSIS — Z0000 Encounter for general adult medical examination without abnormal findings: Secondary | ICD-10-CM | POA: Insufficient documentation

## 2011-10-08 HISTORY — DX: Male erectile dysfunction, unspecified: N52.9

## 2011-10-08 NOTE — Assessment & Plan Note (Signed)
On low dose lovastatin the LL is just at goal of 130 or less with a robust HDL. Liver functions are stable.  Plan Continue present medication.  Continue prudent low fat diet and regular exercise regimen.

## 2011-10-08 NOTE — Assessment & Plan Note (Signed)
Interval medical history is unremarkable. Physical exam, sans rectal, is normal, although he is carrying some excess weight. Lab results are in normal range. Records on colorectal cancer screening requested. Immunization is brought up to date. Weight management: recommend smart food choices among existing food preferences, PORTION SIZE CONTROL-using hand as a quide, regular exercise - 30 min 3 times a week with heart rate of 120+. Target weight - 200 lbs; goal - to loose 1-2 lbs/month.  In summary - a nice man who appears to be medically stable at this time. He is instructed to have a full physical exam in 2 years, follow-up lab in 1 year. He will return otherwise as needed.

## 2011-10-08 NOTE — Assessment & Plan Note (Signed)
Minor problem but should he decide to do more than levitra, cialis, etc. He may want to consider cavernous injections that would require dosing and instruction by urology - his preference would be [place] or [place].

## 2011-10-08 NOTE — Assessment & Plan Note (Signed)
Stable. PSA is not detectable.

## 2011-10-11 ENCOUNTER — Encounter: Payer: Self-pay | Admitting: Internal Medicine

## 2011-11-01 ENCOUNTER — Other Ambulatory Visit: Payer: Self-pay | Admitting: Internal Medicine

## 2012-02-10 ENCOUNTER — Telehealth: Payer: Self-pay | Admitting: *Deleted

## 2012-02-10 NOTE — Telephone Encounter (Signed)
Pt left message asking for return call about having records transferred-left message for pt to callback office.

## 2012-02-16 NOTE — Telephone Encounter (Signed)
No return call from pt after several attempts to contact him, closing phone note.

## 2012-06-15 ENCOUNTER — Other Ambulatory Visit: Payer: Self-pay | Admitting: Internal Medicine

## 2012-06-15 ENCOUNTER — Other Ambulatory Visit: Payer: Self-pay

## 2012-06-15 MED ORDER — LOVASTATIN 20 MG PO TABS: 20.0000 mg | ORAL_TABLET | Freq: Every day | ORAL | Status: DC

## 2012-07-31 ENCOUNTER — Ambulatory Visit (INDEPENDENT_AMBULATORY_CARE_PROVIDER_SITE_OTHER): Payer: BC Managed Care – PPO | Admitting: Internal Medicine

## 2012-07-31 ENCOUNTER — Other Ambulatory Visit (INDEPENDENT_AMBULATORY_CARE_PROVIDER_SITE_OTHER): Payer: BC Managed Care – PPO

## 2012-07-31 ENCOUNTER — Encounter: Payer: Self-pay | Admitting: Internal Medicine

## 2012-07-31 VITALS — BP 122/78 | HR 68 | Temp 98.7°F | Wt 238.1 lb

## 2012-07-31 DIAGNOSIS — R002 Palpitations: Secondary | ICD-10-CM

## 2012-07-31 DIAGNOSIS — Z8249 Family history of ischemic heart disease and other diseases of the circulatory system: Secondary | ICD-10-CM

## 2012-07-31 LAB — TSH: TSH: 0.85 u[IU]/mL (ref 0.35–5.50)

## 2012-07-31 LAB — CBC WITH DIFFERENTIAL/PLATELET
Basophils Relative: 0.7 % (ref 0.0–3.0)
Eosinophils Absolute: 0.2 10*3/uL (ref 0.0–0.7)
Lymphocytes Relative: 33.8 % (ref 12.0–46.0)
MCHC: 34.5 g/dL (ref 30.0–36.0)
MCV: 94.1 fl (ref 78.0–100.0)
Monocytes Absolute: 0.8 10*3/uL (ref 0.1–1.0)
Neutrophils Relative %: 53.2 % (ref 43.0–77.0)
Platelets: 299 10*3/uL (ref 150.0–400.0)
RBC: 4.74 Mil/uL (ref 4.22–5.81)
WBC: 7.7 10*3/uL (ref 4.5–10.5)

## 2012-07-31 MED ORDER — ASPIRIN EC 81 MG PO TBEC: 81.0000 mg | DELAYED_RELEASE_TABLET | Freq: Every day | ORAL | Status: AC

## 2012-07-31 NOTE — Patient Instructions (Signed)
It was good to see you today. Test(s) ordered today. Your results will be released to MyChart (or called to you) after review, usually within 72hours after test completion. If any changes need to be made, you will be notified at that same time. we'll make referral for 24-hour Holter monitor to evaluate for arrhythmia causing palpitation symptoms. Our office will contact you regarding appointment(s) once made. You will then be contacted about results after review Begin baby aspirin 81 mg once daily until testing complete Minimize caffeine and alcohol use as discussed as these substances can exacerbate palpitation symptoms Followup in 3-4 weeks to continue review, call sooner if problems   Palpitations  A palpitation is the feeling that your heartbeat is irregular or is faster than normal. It may feel like your heart is fluttering or skipping a beat. Palpitations are usually not a serious problem. However, in some cases, you may need further medical evaluation. CAUSES  Palpitations can be caused by:  Smoking.  Caffeine or other stimulants, such as diet pills or energy drinks.  Alcohol.  Stress and anxiety.  Strenuous physical activity.  Fatigue.  Certain medicines.  Heart disease, especially if you have a history of arrhythmias. This includes atrial fibrillation, atrial flutter, or supraventricular tachycardia.  An improperly working pacemaker or defibrillator. DIAGNOSIS  To find the cause of your palpitations, your caregiver will take your history and perform a physical exam. Tests may also be done, including:  Electrocardiography (ECG). This test records the heart's electrical activity.  Cardiac monitoring. This allows your caregiver to monitor your heart rate and rhythm in real time.  Holter monitor. This is a portable device that records your heartbeat and can help diagnose heart arrhythmias. It allows your caregiver to track your heart activity for several days, if  needed.  Stress tests by exercise or by giving medicine that makes the heart beat faster. TREATMENT  Treatment of palpitations depends on the cause of your symptoms and can vary greatly. Most cases of palpitations do not require any treatment other than time, relaxation, and monitoring your symptoms. Other causes, such as atrial fibrillation, atrial flutter, or supraventricular tachycardia, usually require further treatment. HOME CARE INSTRUCTIONS   Avoid:  Caffeinated coffee, tea, soft drinks, diet pills, and energy drinks.  Chocolate.  Alcohol.  Stop smoking if you smoke.  Reduce your stress and anxiety. Things that can help you relax include:  A method that measures bodily functions so you can learn to control them (biofeedback).  Yoga.  Meditation.  Physical activity such as swimming, jogging, or walking.  Get plenty of rest and sleep. SEEK MEDICAL CARE IF:   You continue to have a fast or irregular heartbeat beyond 24 hours.  Your palpitations occur more often. SEEK IMMEDIATE MEDICAL CARE IF:  You develop chest pain or shortness of breath.  You have a severe headache.  You feel dizzy, or you faint. MAKE SURE YOU:  Understand these instructions.  Will watch your condition.  Will get help right away if you are not doing well or get worse. Document Released: 03/19/2000 Document Revised: 09/21/2011 Document Reviewed: 05/21/2011 [facility] Patient Information 2013 [place], [place].

## 2012-07-31 NOTE — Progress Notes (Signed)
  Subjective:    Patient ID: Donald [name], male    DOB: 07-06-1955, 57 y.o.   MRN: [id number]  Palpitations  This is a new problem. Episode onset: >6 months ago, increasing frequency. The problem occurs 2 to 4 times per day. The problem has been gradually worsening. On average, each episode lasts 5 seconds. The symptoms are aggravated by stress and caffeine. Associated symptoms include an irregular heartbeat. Pertinent negatives include no anxiety, chest fullness, chest pain, coughing, diaphoresis, dizziness, malaise/fatigue, nausea, near-syncope, numbness, shortness of breath, syncope or vomiting. He has tried nothing for the symptoms. Risk factors include being male, dyslipidemia and family history.   Past Medical History  Diagnosis Date  . HNP (herniated nucleus pulposus)     lumbar spine  . Hyperlipidemia     mild  . Prostate cancer     Review of Systems  Constitutional: Negative for malaise/fatigue and diaphoresis.  Respiratory: Negative for cough and shortness of breath.   Cardiovascular: Positive for palpitations. Negative for chest pain, syncope and near-syncope.  Gastrointestinal: Negative for nausea and vomiting.  Neurological: Negative for dizziness and numbness.       Objective:   Physical Exam BP 122/78  Pulse 68  Temp(Src) 98.7 F (37.1 C) (Oral)  Wt 238 lb 1.9 oz (108.011 kg)  BMI 30.56 kg/m2  SpO2 95% Wt Readings from Last 3 Encounters:  07/31/12 238 lb 1.9 oz (108.011 kg)  10/06/11 223 lb (101.152 kg)  10/01/10 220 lb (99.791 kg)   Constitutional: he appears well-developed and well-nourished. No distress.  HENT: Head: Normocephalic and atraumatic. Ears: B TMs ok, no erythema or effusion; Nose: Nose normal. Mouth/Throat: Oropharynx is clear and moist. No oropharyngeal exudate.  Eyes: Conjunctivae and EOM are normal. Pupils are equal, round, and reactive to light. No scleral icterus.  Neck: Normal range of motion. Neck supple. No JVD present. No thyromegaly  present.  Cardiovascular: Normal rate, regular rhythm and normal heart sounds.  No murmur heard. No BLE edema. Pulmonary/Chest: Effort normal and breath sounds normal. No respiratory distress. he has no wheezes. Psychiatric: he has a normal mood and affect. behavior is normal. Judgment and thought content normal.  Lab Results  Component Value Date   WBC 6.2 02/19/2010   HGB 15.1 02/19/2010   HCT 43.9 02/19/2010   PLT 265.0 02/19/2010   GLUCOSE 98 10/06/2011   CHOL 216* 10/06/2011   TRIG 162.0* 10/06/2011   HDL 50.30 10/06/2011   LDLDIRECT 130.0 10/06/2011   LDLCALC 125* 10/22/2010   ALT 34 10/06/2011   ALT 34 10/06/2011   AST 29 10/06/2011   AST 29 10/06/2011   NA 138 10/06/2011   K 4.1 10/06/2011   CL 101 10/06/2011   CREATININE 0.8 10/06/2011   BUN 10 10/06/2011   CO2 28 10/06/2011   TSH 0.56 02/19/2010   PSA 0.00* 10/06/2011   ECG: NSR @ 71 bpm - No ischemia or arrhythmia    Assessment & Plan:    Palpitations - intermittent -  Describes symptomatic PVC or PACs  Exam benign today Check ECG today - NSR Check CBC and TSH Arrange holter monitor to eval same (occurs several times daily) Consider need for additional stress testing given CRF and FH early CAD - follow up PCP on same 2-4 weeks Advise ASA 81mg  qd until testing complete

## 2012-08-07 ENCOUNTER — Encounter (INDEPENDENT_AMBULATORY_CARE_PROVIDER_SITE_OTHER): Payer: BC Managed Care – PPO

## 2012-08-07 DIAGNOSIS — R002 Palpitations: Secondary | ICD-10-CM

## 2012-08-22 ENCOUNTER — Telehealth: Payer: Self-pay

## 2012-08-22 MED ORDER — OLOPATADINE HCL 0.1 % OP SOLN: 1.0000 [drp] | Freq: Two times a day (BID) | OPHTHALMIC | Status: DC | PRN

## 2012-08-22 NOTE — Telephone Encounter (Signed)
Phone call from pt stating he saw you on 07/31/12 and was referred to be placed on 24 hour heart monitor and he has not heard the results yet. Please advise, thanks.

## 2012-08-23 NOTE — Telephone Encounter (Signed)
[facility] - can you follow up on this order placed 4/28 - thanks!

## 2012-10-31 ENCOUNTER — Telehealth: Payer: Self-pay | Admitting: *Deleted

## 2012-10-31 NOTE — Telephone Encounter (Signed)
Pt called again requesting results from his Holter Monitor 5.5.2014.  Please advise

## 2012-11-01 ENCOUNTER — Telehealth: Payer: Self-pay | Admitting: *Deleted

## 2012-11-01 NOTE — Telephone Encounter (Signed)
Spoke with pt.  Advised of MD result note.

## 2012-11-01 NOTE — Telephone Encounter (Signed)
Holter did not correlate rapid heart rate with palpitation symptoms -  If continued symptoms, I recommended follow up with PCP ([facility]) to review further workup and tx options thanks

## 2012-12-28 ENCOUNTER — Ambulatory Visit (INDEPENDENT_AMBULATORY_CARE_PROVIDER_SITE_OTHER): Payer: BC Managed Care – PPO | Admitting: Internal Medicine

## 2012-12-28 ENCOUNTER — Encounter: Payer: Self-pay | Admitting: Internal Medicine

## 2012-12-28 VITALS — BP 126/80 | HR 68 | Temp 98.3°F | Wt 240.0 lb

## 2012-12-28 DIAGNOSIS — L259 Unspecified contact dermatitis, unspecified cause: Secondary | ICD-10-CM

## 2012-12-28 MED ORDER — PREDNISONE 10 MG PO TABS: 10.0000 mg | ORAL_TABLET | Freq: Every day | ORAL | Status: DC

## 2012-12-28 NOTE — Patient Instructions (Addendum)
Contact dermatitis - once the reaction starts it can be tough to cool it down.  Plan - prednisone burst and taper  For itching - take loratadine (generic claritin) 10 mg twice a day  Call if it doesn't get.  Contact Dermatitis Contact dermatitis is a reaction to certain substances that touch the skin. Contact dermatitis can be either irritant contact dermatitis or allergic contact dermatitis. Irritant contact dermatitis does not require previous exposure to the substance for a reaction to occur.Allergic contact dermatitis only occurs if you have been exposed to the substance before. Upon a repeat exposure, your body reacts to the substance.  CAUSES  Many substances can cause contact dermatitis. Irritant dermatitis is most commonly caused by repeated exposure to mildly irritating substances, such as:  Makeup.  Soaps.  Detergents.  Bleaches.  Acids.  Metal salts, such as nickel. Allergic contact dermatitis is most commonly caused by exposure to:  Poisonous plants.  Chemicals (deodorants, shampoos).  Jewelry.  Latex.  Neomycin in triple antibiotic cream.  Preservatives in products, including clothing. SYMPTOMS  The area of skin that is exposed may develop:  Dryness or flaking.  Redness.  Cracks.  Itching.  Pain or a burning sensation.  Blisters. With allergic contact dermatitis, there may also be swelling in areas such as the eyelids, mouth, or genitals.  DIAGNOSIS  Your caregiver can usually tell what the problem is by doing a physical exam. In cases where the cause is uncertain and an allergic contact dermatitis is suspected, a patch skin test may be performed to help determine the cause of your dermatitis. TREATMENT Treatment includes protecting the skin from further contact with the irritating substance by avoiding that substance if possible. Barrier creams, powders, and gloves may be helpful. Your caregiver may also recommend:  Steroid creams or ointments  applied 2 times daily. For best results, soak the rash area in cool water for 20 minutes. Then apply the medicine. Cover the area with a plastic wrap. You can store the steroid cream in the refrigerator for a "chilly" effect on your rash. That may decrease itching. Oral steroid medicines may be needed in more severe cases.  Antibiotics or antibacterial ointments if a skin infection is present.  Antihistamine lotion or an antihistamine taken by mouth to ease itching.  Lubricants to keep moisture in your skin.  Burow's solution to reduce redness and soreness or to dry a weeping rash. Mix one packet or tablet of solution in 2 cups cool water. Dip a clean washcloth in the mixture, wring it out a bit, and put it on the affected area. Leave the cloth in place for 30 minutes. Do this as often as possible throughout the day.  Taking several cornstarch or baking soda baths daily if the area is too large to cover with a washcloth. Harsh chemicals, such as alkalis or acids, can cause skin damage that is like a burn. You should flush your skin for 15 to 20 minutes with cold water after such an exposure. You should also seek immediate medical care after exposure. Bandages (dressings), antibiotics, and pain medicine may be needed for severely irritated skin.  HOME CARE INSTRUCTIONS  Avoid the substance that caused your reaction.  Keep the area of skin that is affected away from hot water, soap, sunlight, chemicals, acidic substances, or anything else that would irritate your skin.  Do not scratch the rash. Scratching may cause the rash to become infected.  You may take cool baths to help stop the itching.  Only take over-the-counter or prescription medicines as directed by your caregiver.  See your caregiver for follow-up care as directed to make sure your skin is healing properly. SEEK MEDICAL CARE IF:   Your condition is not better after 3 days of treatment.  You seem to be getting worse.  You see  signs of infection such as swelling, tenderness, redness, soreness, or warmth in the affected area.  You have any problems related to your medicines. Document Released: 03/19/2000 Document Revised: 06/14/2011 Document Reviewed: 08/25/2010 [facility] Patient Information 2014 [place], [place].

## 2012-12-28 NOTE — Progress Notes (Signed)
Subjective:    Patient ID: Donald [name], male    DOB: [birth year], 57 y.o.   MRN: [id number]  HPI Mfr. Gater broke out about week ago with a pruritic rash on the forearm right. He has tried OTC anti-itch soap which did not work. NO respiratory symptom  Past Medical History  Diagnosis Date  . HNP (herniated nucleus pulposus)     lumbar spine  . Hyperlipidemia     mild  . Prostate cancer    Past Surgical History  Procedure Laterality Date  . Tonsillectomy    . Prostatectomy      robotic '08 at [facility]   Family History  Problem Relation Age of Onset  . Diabetes Paternal Grandmother   . Cancer Brother     prostate 08  . Stroke Father 33  . Other Father     tobacco abuse  . Alzheimer's disease Mother   . Heart attack Sister 25    fatal  . Coronary artery disease Sister 70    MI and sudden deat age 17  . Obesity Sister   . Other Sister     tobacco abuse   History   Social History  . Marital Status: Married    Spouse Name: N/A    Number of Children: N/A  . Years of Education: N/A   Occupational History  . cpa    Social History Main Topics  . Smoking status: Never Smoker   . Smokeless tobacco: Never Used  . Alcohol Use: 3.0 oz/week    6 drink(s) per week  . Drug Use: No  . Sexual Activity: Yes    Partners: Female   Other Topics Concern  . Not on file   Social History Narrative   [employer]. 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 Oldest step dtr died of suicide. [id number] with him in his business. Marriage in good health.    Current Outpatient Prescriptions on File Prior to Visit  Medication Sig Dispense Refill  . aspirin EC 81 MG tablet Take 1 tablet (81 mg total) by mouth daily.  150 tablet  2  . cetirizine (ZYRTEC) 10 MG tablet Take 10 mg by mouth daily as needed.        . lovastatin (MEVACOR) 20 MG tablet Take 1 tablet (20 mg total) by mouth at bedtime.  30 tablet   5  . olopatadine (PATANOL) 0.1 % ophthalmic solution Place 1 drop into both eyes 2 (two) times daily as needed.  5 mL  1  . tadalafil (CIALIS) 10 MG tablet Take 10 mg by mouth as needed.         No current facility-administered medications on file prior to visit.      Review of Systems System review is negative for any constitutional, cardiac, pulmonary, GI or neuro symptoms or complaints other than as described in the HPI.     Objective:   Physical Exam Filed Vitals:   12/28/12 1638  BP: 126/80  Pulse: 68  Temp: 98.3 F (36.8 C)   Gen'l - WNWD overweight man in no distress Cor- RRR pulm - normal respirations Derm - swath of erythematous macular rash right lateral forearm.       Assessment & Plan:  Contact dermatitis - once the reaction starts it can be tough to cool it down.  Plan - prednisone burst and taper  For itching - take loratadine (generic claritin) 10 mg  twice a day  Call if it doesn't get.

## 2013-01-23 ENCOUNTER — Other Ambulatory Visit: Payer: Self-pay | Admitting: Internal Medicine

## 2013-02-05 ENCOUNTER — Ambulatory Visit (INDEPENDENT_AMBULATORY_CARE_PROVIDER_SITE_OTHER): Payer: BC Managed Care – PPO | Admitting: Internal Medicine

## 2013-02-05 ENCOUNTER — Encounter: Payer: Self-pay | Admitting: Internal Medicine

## 2013-02-05 ENCOUNTER — Other Ambulatory Visit (INDEPENDENT_AMBULATORY_CARE_PROVIDER_SITE_OTHER): Payer: BC Managed Care – PPO

## 2013-02-05 VITALS — BP 112/76 | HR 67 | Temp 97.9°F | Ht 74.0 in | Wt 236.4 lb

## 2013-02-05 DIAGNOSIS — N529 Male erectile dysfunction, unspecified: Secondary | ICD-10-CM

## 2013-02-05 DIAGNOSIS — Z0000 Encounter for general adult medical examination without abnormal findings: Secondary | ICD-10-CM

## 2013-02-05 DIAGNOSIS — E785 Hyperlipidemia, unspecified: Secondary | ICD-10-CM

## 2013-02-05 DIAGNOSIS — Z8546 Personal history of malignant neoplasm of prostate: Secondary | ICD-10-CM

## 2013-02-05 LAB — LIPID PANEL
Cholesterol: 191 mg/dL (ref 0–200)
LDL Cholesterol: 113 mg/dL — ABNORMAL HIGH (ref 0–99)
Total CHOL/HDL Ratio: 4
Triglycerides: 145 mg/dL (ref 0.0–149.0)
VLDL: 29 mg/dL (ref 0.0–40.0)

## 2013-02-05 LAB — HEPATIC FUNCTION PANEL
ALT: 40 U/L (ref 0–53)
AST: 33 U/L (ref 0–37)
Alkaline Phosphatase: 56 U/L (ref 39–117)
Bilirubin, Direct: 0.2 mg/dL (ref 0.0–0.3)
Total Bilirubin: 1 mg/dL (ref 0.3–1.2)

## 2013-02-05 LAB — COMPREHENSIVE METABOLIC PANEL
AST: 33 U/L (ref 0–37)
Albumin: 4.4 g/dL (ref 3.5–5.2)
Alkaline Phosphatase: 56 U/L (ref 39–117)
BUN: 8 mg/dL (ref 6–23)
Creatinine, Ser: 0.7 mg/dL (ref 0.4–1.5)
GFR: 121.32 mL/min (ref >60.00)
Glucose, Bld: 88 mg/dL (ref 70–99)
Total Bilirubin: 1 mg/dL (ref 0.3–1.2)

## 2013-02-05 MED ORDER — LOVASTATIN 20 MG PO TABS: 20.0000 mg | ORAL_TABLET | Freq: Every day | ORAL | Status: DC

## 2013-02-05 NOTE — Assessment & Plan Note (Signed)
Inteval history is benign. Physical exam, sans prostate, normal. Lab reveals normal results, including PSA 0.00. His records re: colonoscopy not on file - will re request from [facility] clinic with recommendations for followup after review of last study. He is current with Tdap. He is followed by optometry and ophthalmology for cataracts.  In summary A nice man who appears to be medically stable.

## 2013-02-05 NOTE — Assessment & Plan Note (Signed)
Taking and tolerating low dose "statin" therapy. Lab reveals LDL better than goal of 130 or less; HDL better than goal of 40+; liver functions normal.  Plan Continue present medication

## 2013-02-05 NOTE — Assessment & Plan Note (Signed)
PSA surveillance has been normal. Patient does report impotence that is not relived by viagra,cialis, etc.  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

## 2013-02-05 NOTE — Assessment & Plan Note (Signed)
Continue problem.  Plan If interested GU referral for covernous injection therapy vs prosthesis.

## 2013-02-05 NOTE — Patient Instructions (Signed)
Thanks for coming.   You seem to be very stable medically. If you have any recurrent problems with you achilles tendon I recommend seeing Dr. Antoine Primas.  Routine labs today and results will be posted to MyChart.  Your Mevacor - Rx sent in.   You are current for health maintenance but we need the colonoscopy report and any path report.

## 2013-02-05 NOTE — Progress Notes (Signed)
Subjective:    Patient ID: Donald [name], male    DOB: 1956-02-16, 57 y.o.   MRN: [id number]  HPI Donald [name] presents for a routine medical screening. He has had no major illness, no injury and no surgery. He was treated for contact dermatitis and the steroids also helped the achilles tendonitis right foot.   Past Medical History  Diagnosis Date  . HNP (herniated nucleus pulposus)     lumbar spine  . Hyperlipidemia     mild  . Prostate cancer    Past Surgical History  Procedure Laterality Date  . Tonsillectomy    . Prostatectomy      robotic '08 at [facility]   Family History  Problem Relation Age of Onset  . Diabetes Paternal Grandmother   . Cancer Brother     prostate 08  . Stroke Father 28  . Other Father     tobacco abuse  . Alzheimer's disease Mother   . Heart attack Sister 36    fatal  . Coronary artery disease Sister 39    MI and sudden deat age 53  . Obesity Sister   . Other Sister     tobacco abuse   History   Social History  . Marital Status: Married    Spouse Name: N/A    Number of Children: N/A  . Years of Education: N/A   Occupational History  . cpa    Social History Main Topics  . Smoking status: Never Smoker   . Smokeless tobacco: Never Used  . Alcohol Use: 3.0 oz/week    6 drink(s) per week  . Drug Use: No  . Sexual Activity: Yes    Partners: Female   Other Topics Concern  . Not on file   Social History Narrative   [employer]. 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 Oldest step dtr died of suicide. JY:[id number] with him in his business. Marriage in good health.     Current Outpatient Prescriptions on File Prior to Visit  Medication Sig Dispense Refill  . aspirin EC 81 MG tablet Take 1 tablet (81 mg total) by mouth daily.  150 tablet  2  . cetirizine (ZYRTEC) 10 MG tablet Take 10 mg by mouth daily as needed.        . lovastatin (MEVACOR) 20 MG tablet  Take 1 tablet (20 mg total) by mouth at bedtime.  30 tablet  5  . lovastatin (MEVACOR) 20 MG tablet TAKE 1 TABLET BY MOUTH AT BEDTIME  30 tablet  6  . olopatadine (PATANOL) 0.1 % ophthalmic solution Place 1 drop into both eyes 2 (two) times daily as needed.  5 mL  1  . predniSONE (DELTASONE) 10 MG tablet Take 1 tablet (10 mg total) by mouth daily. 3 tabs daily for 3 days; 2 tabs daily for 3 days; 1 tab daily for 6 days  21 tablet  0  . tadalafil (CIALIS) 10 MG tablet Take 10 mg by mouth as needed.         No current facility-administered medications on file prior to visit.         Review of Systems Constitutional:  Negative for fever, chills, activity change and unexpected weight change.  HEENT:  Negative for hearing loss, ear pain, congestion, neck stiffness and postnasal drip. Negative for sore throat or swallowing problems. Negative for dental complaints.   Eyes: Negative for vision loss or change  in visual acuity.  Respiratory: Negative for chest tightness and wheezing. Negative for DOE.   Cardiovascular: Negative for chest pain or palpitations. No decreased exercise tolerance Gastrointestinal: No change in bowel habit. No bloating or gas. No reflux or indigestion Genitourinary: Negative for urgency, frequency, flank pain and difficulty urinating.  Musculoskeletal: Negative for myalgias, back pain, arthralgias and gait problem.  Neurological: Negative for dizziness, tremors, weakness and headaches.  Hematological: Negative for adenopathy.  Psychiatric/Behavioral: Negative for behavioral problems and dysphoric mood.       Objective:   Physical Exam Filed Vitals:   02/05/13 1031  BP: 112/76  Pulse: 67  Temp: 97.9 F (36.6 C)   Wt Readings from Last 3 Encounters:  02/05/13 236 lb 6.4 oz (107.23 kg)  12/28/12 240 lb (108.863 kg)  07/31/12 238 lb 1.9 oz (108.011 kg)   Gen'l: Well nourished well developed     male in no acute distress  HEENT: Head: Normocephalic and  atraumatic. Right Ear: External ear normal. EAC/TM nl. Left Ear: External ear normal.  EAC/TM nl. Nose: Nose normal. Mouth/Throat: Oropharynx is clear and moist. Dentition - native, in good repair. No buccal or palatal lesions. Posterior pharynx clear. Eyes: Conjunctivae and sclera clear. EOM intact. Pupils are equal, round, and reactive to light. Right eye exhibits no discharge. Left eye exhibits no discharge. Unable to visualize fundi OD, OS is normal Neck: Normal range of motion. Neck supple. No JVD present. No tracheal deviation present. No thyromegaly present.  Cardiovascular: Normal rate, regular rhythm, no gallop, no friction rub, no murmur heard.      Quiet precordium. 2+ radial and DP pulses . No carotid bruits Pulmonary/Chest: Effort normal. No respiratory distress or increased WOB, no wheezes, no rales. No chest wall deformity or CVAT. Abdomen: Soft. Bowel sounds are normal in all quadrants. He exhibits no distension, no tenderness, no rebound or guarding, No heptosplenomegaly  Genitourinary:  deferred Musculoskeletal: Normal range of motion. He exhibits no edema and no tenderness.       Small and large joints without redness, synovial thickening or deformity. Full range of motion preserved about all small, median and large joints.  Lymphadenopathy:    He has no cervical or supraclavicular adenopathy.  Neurological: He is alert and oriented to person, place, and time. CN II-XII intact. DTRs 2+ and symmetrical biceps, radial and patellar tendons. Cerebellar function normal with no tremor, rigidity, normal gait and station.  Skin: Skin is warm and dry. No rash noted. No erythema.  Psychiatric: He has a normal mood and affect. His behavior is normal. Thought content normal.   Recent Results (from the past 2160 hour(s))  HEPATIC FUNCTION PANEL     Status: None   Collection Time    02/05/13 11:26 AM      Result Value Range   Total Bilirubin 1.0  0.3 - 1.2 mg/dL   Bilirubin, Direct 0.2  0.0  - 0.3 mg/dL   Alkaline Phosphatase 56  39 - 117 U/L   AST 33  0 - 37 U/L   ALT 40  0 - 53 U/L   Total Protein 7.2  6.0 - 8.3 g/dL   Albumin 4.4  3.5 - 5.2 g/dL  COMPREHENSIVE METABOLIC PANEL     Status: None   Collection Time    02/05/13 11:26 AM      Result Value Range   Sodium 141  135 - 145 mEq/L   Potassium 4.1  3.5 - 5.1 mEq/L   Chloride 107  96 - 112 mEq/L   CO2 26  19 - 32 mEq/L   Glucose, Bld 88  70 - 99 mg/dL   BUN 8  6 - 23 mg/dL   Creatinine, Ser 0.7  0.4 - 1.5 mg/dL   Total Bilirubin 1.0  0.3 - 1.2 mg/dL   Alkaline Phosphatase 56  39 - 117 U/L   AST 33  0 - 37 U/L   ALT 40  0 - 53 U/L   Total Protein 7.2  6.0 - 8.3 g/dL   Albumin 4.4  3.5 - 5.2 g/dL   Calcium 9.2  8.4 - 40.9 mg/dL   GFR 811.91  >47.82 mL/min  LIPID PANEL     Status: Abnormal   Collection Time    02/05/13 11:26 AM      Result Value Range   Cholesterol 191  0 - 200 mg/dL   Comment: ATP III Classification       Desirable:  < 200 mg/dL               Borderline High:  200 - 239 mg/dL          High:  > = 956 mg/dL   Triglycerides 213.0  0.0 - 149.0 mg/dL   Comment: Normal:  <865 mg/dLBorderline High:  150 - 199 mg/dL   HDL 78.46  >96.29 mg/dL   VLDL 52.8  0.0 - 41.3 mg/dL   LDL Cholesterol 244 (*) 0 - 99 mg/dL   Total CHOL/HDL Ratio 4     Comment:                Men          Women1/2 Average Risk     3.4          3.3Average Risk          5.0          4.42X Average Risk          9.6          7.13X Average Risk          15.0          11.0                      PSA     Status: Abnormal   Collection Time    02/05/13 11:26 AM      Result Value Range   PSA 0.00 (*) 0.10 - 4.00 ng/mL         Assessment & Plan:

## 2013-03-22 ENCOUNTER — Encounter: Payer: Self-pay | Admitting: Internal Medicine

## 2013-03-23 NOTE — Telephone Encounter (Signed)
I checked with medical records and they will look in to this and get back to me regarding any colon reports.

## 2013-04-05 DIAGNOSIS — L0390 Cellulitis, unspecified: Secondary | ICD-10-CM

## 2013-04-05 HISTORY — DX: Cellulitis, unspecified: L03.90

## 2013-07-02 ENCOUNTER — Encounter (INDEPENDENT_AMBULATORY_CARE_PROVIDER_SITE_OTHER): Payer: BC Managed Care – PPO | Admitting: Ophthalmology

## 2013-07-02 DIAGNOSIS — H2510 Age-related nuclear cataract, unspecified eye: Secondary | ICD-10-CM

## 2013-07-02 DIAGNOSIS — H4310 Vitreous hemorrhage, unspecified eye: Secondary | ICD-10-CM

## 2013-07-02 DIAGNOSIS — H33309 Unspecified retinal break, unspecified eye: Secondary | ICD-10-CM

## 2013-07-02 DIAGNOSIS — H43819 Vitreous degeneration, unspecified eye: Secondary | ICD-10-CM

## 2013-07-06 ENCOUNTER — Encounter (INDEPENDENT_AMBULATORY_CARE_PROVIDER_SITE_OTHER): Payer: BC Managed Care – PPO | Admitting: Ophthalmology

## 2013-07-06 DIAGNOSIS — H33309 Unspecified retinal break, unspecified eye: Secondary | ICD-10-CM

## 2013-07-18 ENCOUNTER — Encounter (INDEPENDENT_AMBULATORY_CARE_PROVIDER_SITE_OTHER): Payer: BC Managed Care – PPO | Admitting: Ophthalmology

## 2013-07-18 DIAGNOSIS — H33309 Unspecified retinal break, unspecified eye: Secondary | ICD-10-CM

## 2013-07-24 ENCOUNTER — Encounter (INDEPENDENT_AMBULATORY_CARE_PROVIDER_SITE_OTHER): Payer: BC Managed Care – PPO | Admitting: Ophthalmology

## 2013-07-24 ENCOUNTER — Encounter (INDEPENDENT_AMBULATORY_CARE_PROVIDER_SITE_OTHER): Payer: Self-pay | Admitting: Ophthalmology

## 2013-09-17 ENCOUNTER — Encounter (INDEPENDENT_AMBULATORY_CARE_PROVIDER_SITE_OTHER): Payer: BC Managed Care – PPO | Admitting: Ophthalmology

## 2013-09-17 DIAGNOSIS — H2510 Age-related nuclear cataract, unspecified eye: Secondary | ICD-10-CM

## 2013-09-17 DIAGNOSIS — H33309 Unspecified retinal break, unspecified eye: Secondary | ICD-10-CM

## 2013-09-17 DIAGNOSIS — H43819 Vitreous degeneration, unspecified eye: Secondary | ICD-10-CM

## 2013-11-26 ENCOUNTER — Encounter (INDEPENDENT_AMBULATORY_CARE_PROVIDER_SITE_OTHER): Payer: BC Managed Care – PPO | Admitting: Ophthalmology

## 2013-11-26 DIAGNOSIS — H2510 Age-related nuclear cataract, unspecified eye: Secondary | ICD-10-CM

## 2013-11-26 DIAGNOSIS — H43819 Vitreous degeneration, unspecified eye: Secondary | ICD-10-CM

## 2013-11-26 DIAGNOSIS — H33309 Unspecified retinal break, unspecified eye: Secondary | ICD-10-CM

## 2014-02-22 ENCOUNTER — Encounter: Payer: Self-pay | Admitting: Internal Medicine

## 2014-02-22 ENCOUNTER — Other Ambulatory Visit (INDEPENDENT_AMBULATORY_CARE_PROVIDER_SITE_OTHER): Payer: BC Managed Care – PPO

## 2014-02-22 ENCOUNTER — Ambulatory Visit (INDEPENDENT_AMBULATORY_CARE_PROVIDER_SITE_OTHER): Payer: BC Managed Care – PPO | Admitting: Internal Medicine

## 2014-02-22 VITALS — BP 116/84 | HR 81 | Temp 98.0°F | Resp 18 | Ht 74.0 in | Wt 236.8 lb

## 2014-02-22 DIAGNOSIS — Z8042 Family history of malignant neoplasm of prostate: Secondary | ICD-10-CM

## 2014-02-22 DIAGNOSIS — Z8546 Personal history of malignant neoplasm of prostate: Secondary | ICD-10-CM

## 2014-02-22 DIAGNOSIS — E785 Hyperlipidemia, unspecified: Secondary | ICD-10-CM

## 2014-02-22 DIAGNOSIS — E669 Obesity, unspecified: Secondary | ICD-10-CM

## 2014-02-22 DIAGNOSIS — Z0000 Encounter for general adult medical examination without abnormal findings: Secondary | ICD-10-CM

## 2014-02-22 LAB — BASIC METABOLIC PANEL
BUN: 12 mg/dL (ref 6–23)
CHLORIDE: 106 meq/L (ref 96–112)
CO2: 26 mEq/L (ref 19–32)
CREATININE: 0.8 mg/dL (ref 0.4–1.5)
Calcium: 9.2 mg/dL (ref 8.4–10.5)
GFR: 102.36 mL/min (ref >60.00)
Glucose, Bld: 98 mg/dL (ref 70–99)
Potassium: 4.6 mEq/L (ref 3.5–5.1)
SODIUM: 138 meq/L (ref 135–145)

## 2014-02-22 LAB — LIPID PANEL
Cholesterol: 204 mg/dL — ABNORMAL HIGH (ref 0–200)
HDL: 41.7 mg/dL (ref >39.00)
NONHDL: 162.3
TRIGLYCERIDES: 221 mg/dL — AB (ref 0.0–149.0)
Total CHOL/HDL Ratio: 5
VLDL: 44.2 mg/dL — ABNORMAL HIGH (ref 0.0–40.0)

## 2014-02-22 LAB — PSA: PSA: 0 ng/mL — ABNORMAL LOW (ref 0.10–4.00)

## 2014-02-22 LAB — LDL CHOLESTEROL, DIRECT: LDL DIRECT: 114.9 mg/dL

## 2014-02-22 NOTE — Progress Notes (Signed)
Pre visit review using our clinic review tool, if applicable. No additional management support is needed unless otherwise documented below in the visit note. 

## 2014-02-22 NOTE — Patient Instructions (Signed)
We will check your blood work today. We will call you back with the results.   We will see back in one year for your checkup. Please feel free to call us if you have any new problems or questions before your next visit.  Health Maintenance A healthy lifestyle and preventative care can promote health and wellness.  Maintain regular health, dental, and eye exams.  Eat a healthy diet. Foods like vegetables, fruits, whole grains, low-fat dairy products, and lean protein foods contain the nutrients you need and are low in calories. Decrease your intake of foods high in solid fats, added sugars, and salt. Get information about a proper diet from your health care provider, if necessary.  Regular physical exercise is one of the most important things you can do for your health. Most adults should get at least 150 minutes of moderate-intensity exercise (any activity that increases your heart rate and causes you to sweat) each week. In addition, most adults need muscle-strengthening exercises on 2 or more days a week.   Maintain a healthy weight. The body mass index (BMI) is a screening tool to identify possible weight problems. It provides an estimate of body fat based on height and weight. Your health care provider can find your BMI and can help you achieve or maintain a healthy weight. For males 20 years and older:  A BMI below 18.5 is considered underweight.  A BMI of 18.5 to 24.9 is normal.  A BMI of 25 to 29.9 is considered overweight.  A BMI of 30 and above is considered obese.  Maintain normal blood lipids and cholesterol by exercising and minimizing your intake of saturated fat. Eat a balanced diet with plenty of fruits and vegetables. Blood tests for lipids and cholesterol should begin at age 33 and be repeated every 5 years. If your lipid or cholesterol levels are high, you are over age 58, or you are at high risk for heart disease, you may need your cholesterol levels checked more  frequently.Ongoing high lipid and cholesterol levels should be treated with medicines if diet and exercise are not working.  If you smoke, find out from your health care provider how to quit. If you do not use tobacco, do not start.  Lung cancer screening is recommended for adults aged 95-80 years who are at high risk for developing lung cancer because of a history of smoking. A yearly low-dose CT scan of the lungs is recommended for people who have at least a 30-pack-year history of smoking and are current smokers or have quit within the past 15 years. A pack year of smoking is smoking an average of 1 pack of cigarettes a day for 1 year (for example, a 30-pack-year history of smoking could mean smoking 1 pack a day for 30 years or 2 packs a day for 15 years). Yearly screening should continue until the smoker has stopped smoking for at least 15 years. Yearly screening should be stopped for people who develop a health problem that would prevent them from having lung cancer treatment.  If you choose to drink alcohol, do not have more than 2 drinks per day. One drink is considered to be 12 oz (360 mL) of beer, 5 oz (150 mL) of wine, or 1.5 oz (45 mL) of liquor.  Avoid the use of street drugs. Do not share needles with anyone. Ask for help if you need support or instructions about stopping the use of drugs.  High blood pressure causes heart disease  and increases the risk of stroke. Blood pressure should be checked at least every 1-2 years. Ongoing high blood pressure should be treated with medicines if weight loss and exercise are not effective.  If you are 29-25 years old, ask your health care provider if you should take aspirin to prevent heart disease.  Diabetes screening involves taking a blood sample to check your fasting blood sugar level. This should be done once every 3 years after age 29 if you are at a normal weight and without risk factors for diabetes. Testing should be considered at a younger  age or be carried out more frequently if you are overweight and have at least 1 risk factor for diabetes.  Colorectal cancer can be detected and often prevented. Most routine colorectal cancer screening begins at the age of 1 and continues through age 35. However, your health care provider may recommend screening at an earlier age if you have risk factors for colon cancer. On a yearly basis, your health care provider may provide home test kits to check for hidden blood in the stool. A small camera at the end of a tube may be used to directly examine the colon (sigmoidoscopy or colonoscopy) to detect the earliest forms of colorectal cancer. Talk to your health care provider about this at age 109 when routine screening begins. A direct exam of the colon should be repeated every 5-10 years through age 51, unless early forms of precancerous polyps or small growths are found.  People who are at an increased risk for hepatitis B should be screened for this virus. You are considered at high risk for hepatitis B if:  You were born in a country where hepatitis B occurs often. Talk with your health care provider about which countries are considered high risk.  Your parents were born in a high-risk country and you have not received a shot to protect against hepatitis B (hepatitis B vaccine).  You have HIV or AIDS.  You use needles to inject street drugs.  You live with, or have sex with, someone who has hepatitis B.  You are a man who has sex with other men (MSM).  You get hemodialysis treatment.  You take certain medicines for conditions like cancer, organ transplantation, and autoimmune conditions.  Hepatitis C blood testing is recommended for all people born from [birth year] through 1965 and any individual with known risk factors for hepatitis C.  Healthy men should no longer receive prostate-specific antigen (PSA) blood tests as part of routine cancer screening. Talk to your health care provider about  prostate cancer screening.  Testicular cancer screening is not recommended for adolescents or adult males who have no symptoms. Screening includes self-exam, a health care provider exam, and other screening tests. Consult with your health care provider about any symptoms you have or any concerns you have about testicular cancer.  Practice safe sex. Use condoms and avoid high-risk sexual practices to reduce the spread of sexually transmitted infections (STIs).  You should be screened for STIs, including gonorrhea and chlamydia if:  You are sexually active and are younger than 24 years.  You are older than 24 years, and your health care provider tells you that you are at risk for this type of infection.  Your sexual activity has changed since you were last screened, and you are at an increased risk for chlamydia or gonorrhea. Ask your health care provider if you are at risk.  If you are at risk of being infected with  HIV, it is recommended that you take a prescription medicine daily to prevent HIV infection. This is called pre-exposure prophylaxis (PrEP). You are considered at risk if:  You are a man who has sex with other men (MSM).  You are a heterosexual man who is sexually active with multiple partners.  You take drugs by injection.  You are sexually active with a partner who has HIV.  Talk with your health care provider about whether you are at high risk of being infected with HIV. If you choose to begin PrEP, you should first be tested for HIV. You should then be tested every 3 months for as long as you are taking PrEP.  Use sunscreen. Apply sunscreen liberally and repeatedly throughout the day. You should seek shade when your shadow is shorter than you. Protect yourself by wearing long sleeves, pants, a wide-brimmed hat, and sunglasses year round whenever you are outdoors.  Tell your health care provider of new moles or changes in moles, especially if there is a change in shape or  color. Also, tell your health care provider if a mole is larger than the size of a pencil eraser.  A one-time screening for abdominal aortic aneurysm (AAA) and surgical repair of large AAAs by ultrasound is recommended for men aged 1-75 years who are current or former smokers.  Stay current with your vaccines (immunizations). Document Released: 09/18/2007 Document Revised: 03/27/2013 Document Reviewed: 08/17/2010 [facility] Patient Information 2015 [place], [place]. This information is not intended to replace advice given to you by your health care provider. Make sure you discuss any questions you have with your health care provider.

## 2014-02-24 DIAGNOSIS — E669 Obesity, unspecified: Secondary | ICD-10-CM | POA: Insufficient documentation

## 2014-02-24 NOTE — Assessment & Plan Note (Signed)
Spoke with him about the need to lose a little bit of weight. Advised he exercise regularly and improve his diet.

## 2014-02-24 NOTE — Assessment & Plan Note (Signed)
Continue lovastatin, check lipid panel.

## 2014-02-24 NOTE — Assessment & Plan Note (Signed)
Patient had colonoscopy reports in 2008, reports much difficulty with getting records transferred to our facility.. This PCP had tried several times to get records. He states that he was told to come back in 10 years which would mean that he would be due for repeat in 2018. Up-to-date on immunizations and screening. Talked to him about diet and exercise.

## 2014-02-24 NOTE — Progress Notes (Signed)
   Subjective:    Patient ID: Donald [name], male    DOB: Jun 29, 1955, 58 y.o.   MRN: [id number]  HPI The patient is a 58 year old man who comes in today to establish care. He has past medical history of prostate cancer status post prostatectomy, hyperlipidemia, obesity. He does well overall and denies any new complaints. He states that his diet is little better but he is not exercising. He does not have any major urinary complaints. Denies chest pain, shortness of breath, abdominal pain.  Review of Systems  Constitutional: Negative for fever, activity change, appetite change, fatigue and unexpected weight change.  HENT: Negative.   Eyes: Negative.   Respiratory: Negative for cough, chest tightness, shortness of breath and wheezing.   Cardiovascular: Negative for chest pain, palpitations and leg swelling.  Gastrointestinal: Negative for abdominal pain, diarrhea, constipation and abdominal distention.  Genitourinary: Positive for frequency. Negative for difficulty urinating.  Musculoskeletal: Negative.   Skin: Negative.   Neurological: Negative.   Psychiatric/Behavioral: Negative.       Objective:   Physical Exam  Constitutional: He is oriented to person, place, and time. He appears well-developed and well-nourished.  HENT:  Head: Normocephalic and atraumatic.  Eyes: EOM are normal.  Neck: Normal range of motion.  Cardiovascular: Normal rate and regular rhythm.   Pulmonary/Chest: Effort normal and breath sounds normal. No respiratory distress. He has no wheezes. He has no rales.  Abdominal: Soft. Bowel sounds are normal. He exhibits no distension. There is no tenderness. There is no rebound.  Musculoskeletal: He exhibits no edema or tenderness.  Neurological: He is alert and oriented to person, place, and time. Coordination normal.  Skin: Skin is warm and dry.   Filed Vitals:   02/22/14 0841  BP: 116/84  Pulse: 81  Temp: 98 F (36.7 C)  TempSrc: Oral  Resp: 18  Height: 6\' 2"   (1.88 m)  Weight: 236 lb 12.8 oz (107.412 kg)  SpO2: 94%      Assessment & Plan:

## 2014-02-24 NOTE — Assessment & Plan Note (Signed)
He has mild urinary symptoms status post prostatectomy. Check PSA.

## 2014-03-04 ENCOUNTER — Other Ambulatory Visit: Payer: Self-pay | Admitting: Geriatric Medicine

## 2014-03-04 MED ORDER — LOVASTATIN 20 MG PO TABS: 20.0000 mg | ORAL_TABLET | Freq: Every day | ORAL | Status: DC

## 2014-03-07 ENCOUNTER — Telehealth: Payer: Self-pay | Admitting: Internal Medicine

## 2014-03-07 NOTE — Telephone Encounter (Signed)
Received 7 pages of medical records from [facility], sent to Dr. Doug Sou. 03/07/14/ss

## 2014-06-07 ENCOUNTER — Telehealth: Payer: Self-pay | Admitting: *Deleted

## 2014-06-07 NOTE — Telephone Encounter (Signed)
Unable to reach patient at time of Pre-Visit call.   LMOM.

## 2014-06-10 ENCOUNTER — Ambulatory Visit (INDEPENDENT_AMBULATORY_CARE_PROVIDER_SITE_OTHER): Payer: 59 | Admitting: Internal Medicine

## 2014-06-10 ENCOUNTER — Encounter: Payer: Self-pay | Admitting: Internal Medicine

## 2014-06-10 VITALS — BP 108/68 | HR 81 | Temp 98.0°F | Ht 74.0 in | Wt 244.2 lb

## 2014-06-10 DIAGNOSIS — Z0000 Encounter for general adult medical examination without abnormal findings: Secondary | ICD-10-CM

## 2014-06-10 DIAGNOSIS — Z8546 Personal history of malignant neoplasm of prostate: Secondary | ICD-10-CM

## 2014-06-10 DIAGNOSIS — E785 Hyperlipidemia, unspecified: Secondary | ICD-10-CM

## 2014-06-10 DIAGNOSIS — N5231 Erectile dysfunction following radical prostatectomy: Secondary | ICD-10-CM

## 2014-06-10 NOTE — Patient Instructions (Signed)
  Come back to the office by 02-2015  for a physical exam  Please schedule an appointment at the front desk    Come back fasting

## 2014-06-10 NOTE — Assessment & Plan Note (Signed)
History of a colonoscopy 02-2006 at [facility], had 2 polyps (per pt benign), patient was told to repeat in 10 years. I see the report of a colonoscopy that recently got to the chart,  I don't see a pathology report. Plan: Refer to our GI 2017

## 2014-06-10 NOTE — Assessment & Plan Note (Signed)
Since the surgery at [facility], he is doing well, last PSA 0. Does not see a local urologist. Plan: Reassess yearly PSAs.

## 2014-06-10 NOTE — Progress Notes (Signed)
Pre visit review using our clinic review tool, if applicable. No additional management support is needed unless otherwise documented below in the visit note. 

## 2014-06-10 NOTE — Assessment & Plan Note (Addendum)
+   family history of CAD and stroke, last LDL 114 (on medication). Recommend to get the LDL closer to 100. Continue with lovastatin, discussed diet and exercise with him. Calorie counting?

## 2014-06-10 NOTE — Assessment & Plan Note (Addendum)
Problems with ED since prostatectomy 2008, Cialis helps a little. We discussed the option of see urology for local injections, that was also discussed w/ previous PCP. Patient will think about it

## 2014-06-10 NOTE — Progress Notes (Signed)
Subjective:    Patient ID: Donald [name], male    DOB: 1955/04/16, 59 y.o.   MRN: [id number]  DOS:  06/10/2014 Type of visit - description : transferring to this office d/t location Interval history: Here to get established. In general feeling well, has not been taking aspirin,  he simply stopped taking it for a procedure and never went back. Chart is reviewed.    Review of Systems Denies chest or difficulty breathing No nausea, vomiting, diarrhea No dysuria, gross hematuria, difficulty urinating or urinary incontinence  Past Medical History  Diagnosis Date  . HNP (herniated nucleus pulposus)     lumbar spine  . Hyperlipidemia     mild  . Prostate cancer   . ED (erectile dysfunction) 10/08/2011    Following nerve sparing robotic prostatectomy he has decreased tumescence     Past Surgical History  Procedure Laterality Date  . Tonsillectomy    . Prostatectomy      robotic '08 at [place]  . Back surgery      ~ 2005    History   Social History  . Marital Status: Married    Spouse Name: N/A  . Number of Children: 0  . Years of Education: N/A   Occupational History  . cpa    Social History Main Topics  . Smoking status: Never Smoker   . Smokeless tobacco: Never Used  . Alcohol Use: 3.0 oz/week    6 drink(s) per week  . Drug Use: No  . Sexual Activity:    Partners: Female   Other Topics Concern  . Not on file   Social History Narrative   [employer]. 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    Oldest step dtr died of suicide.    Marriage in good health.        Medication List       This list is accurate as of: 06/10/14 11:59 PM.  Always use your most recent med list.               aspirin EC 81 MG tablet  Take 1 tablet (81 mg total) by mouth daily.     cetirizine 10 MG tablet  Commonly known as:  ZYRTEC  Take 10 mg by mouth daily as needed.     lovastatin 20 MG tablet  Commonly known as:  MEVACOR  Take 1 tablet (20 mg total) by mouth at bedtime.     olopatadine 0.1 % ophthalmic solution  Commonly known as:  PATANOL  Place 1 drop into both eyes 2 (two) times daily as needed.     tadalafil 10 MG tablet  Commonly known as:  CIALIS  Take 10 mg by mouth as needed.           Objective:   Physical Exam BP 108/68 mmHg  Pulse 81  Temp(Src) 98 F (36.7 C) (Oral)  Ht 6\' 2"  (1.88 m)  Wt 244 lb 4 oz (110.791 kg)  BMI 31.35 kg/m2  SpO2 97% General:   Well developed, well nourished . NAD.  HEENT:  Normocephalic . Face symmetric, atraumatic Lungs:  CTA B Normal respiratory effort, no intercostal retractions, no accessory muscle use. Heart: RRR,  no murmur.  Muscle skeletal: no pretibial edema bilaterally  Skin: Not pale. Not jaundice Neurologic:  alert & oriented X3.  Speech normal, gait appropriate for age and unassisted Psych--  Cognition  and judgment appear intact.  Cooperative with normal attention span and concentration.  Behavior appropriate. No anxious or depressed appearing.       Assessment & Plan:

## 2014-06-17 ENCOUNTER — Telehealth: Payer: Self-pay | Admitting: Internal Medicine

## 2014-06-17 NOTE — Telephone Encounter (Signed)
[facility] - Client [place] [facility] Patient Name: Donald [name] DOB: 02-15-1956 Initial Comment Caller states thinks he has blood clots in his legs. Symptoms are back of leg pain ,up by knee. Nurse Assessment Nurse: Markus Daft, RN, Sherre Poot Date/Time (Eastern Time): 06/17/2014 4:09:06 PM Confirm and document reason for call. If symptomatic, describe symptoms. ---Caller states that he is having back of calf pain just below the knee. Noticed 3 wks. ago. But hot to touch today. No discoloration or swelling. No fever. Has the patient traveled out of the country within the last 30 days? ---Not Applicable Does the patient require triage? ---Yes Related visit to physician within the last 2 weeks? ---No Does the PT have any chronic conditions? (i.e. diabetes, asthma, etc.) ---Yes List chronic conditions. ---High cholesterol Guidelines Guideline Title Affirmed Question Affirmed Notes Leg Pain [1] Thigh or calf pain AND [2] only 1 side AND [3] present > 1 hour pain with walking, not severe Final Disposition User See Physician within 4 Hours (or PCP triage) Markus Daft, RN, Sherre Poot Going to [facility] ER.

## 2014-07-29 ENCOUNTER — Telehealth: Payer: Self-pay | Admitting: Internal Medicine

## 2014-07-29 DIAGNOSIS — N529 Male erectile dysfunction, unspecified: Secondary | ICD-10-CM

## 2014-07-29 NOTE — Telephone Encounter (Signed)
Caller name: Daymon Relation to pt: self Call back number: [phone] Pharmacy:  Reason for call:   Requesting a referral to Dr. Denyce Robert at [facility]  for erectile dysfunction. [place]. [phone]

## 2014-07-29 NOTE — Telephone Encounter (Signed)
Please advise 

## 2014-07-29 NOTE — Telephone Encounter (Signed)
Yes, please arrange a referral

## 2014-07-30 NOTE — Telephone Encounter (Signed)
Referral placed.

## 2014-07-31 NOTE — Telephone Encounter (Signed)
Patient states that he is requesting Dr. Denyce Robert, not Dr. Amalia Hailey.

## 2014-08-01 NOTE — Telephone Encounter (Signed)
Patient called back stating leave referral as is. He will see Dr. Amalia Hailey

## 2014-08-21 ENCOUNTER — Encounter: Payer: Self-pay | Admitting: Internal Medicine

## 2014-09-09 ENCOUNTER — Other Ambulatory Visit: Payer: Self-pay | Admitting: Internal Medicine

## 2014-11-13 ENCOUNTER — Telehealth: Payer: Self-pay | Admitting: Internal Medicine

## 2014-11-13 DIAGNOSIS — Z1283 Encounter for screening for malignant neoplasm of skin: Secondary | ICD-10-CM

## 2014-11-13 NOTE — Telephone Encounter (Signed)
Referral placed.

## 2014-11-13 NOTE — Telephone Encounter (Signed)
Pt needs referral for dermatology for annual dermatology visit. He has appt scheduled tomorrow. Please call him once referral is entered so he knows if he needs to reschedule appt.  Santel Tonia Brooms

## 2014-11-13 NOTE — Telephone Encounter (Signed)
Insurance auth # [id number] aware

## 2014-12-02 ENCOUNTER — Telehealth: Payer: Self-pay | Admitting: Internal Medicine

## 2014-12-02 ENCOUNTER — Ambulatory Visit (INDEPENDENT_AMBULATORY_CARE_PROVIDER_SITE_OTHER): Payer: 59 | Admitting: Ophthalmology

## 2014-12-02 DIAGNOSIS — H3320 Serous retinal detachment, unspecified eye: Secondary | ICD-10-CM

## 2014-12-02 NOTE — Telephone Encounter (Signed)
Please inform Pt that referral has been placed.

## 2014-12-02 NOTE — Telephone Encounter (Signed)
Relation to pt: self  Call back number: [phone]   Reason for call:  Patient requesting a referral to John D. Zigmund Daniel, MD - [facility] [address], [place], [place] [address] Phone: [phone]. Patient states he tore his retina a year ago and has to follow up with specialist. Patient would like a call when referral is placed so he can schedule his appointment. Please advise

## 2014-12-02 NOTE — Telephone Encounter (Signed)
Patient informed. 

## 2014-12-18 ENCOUNTER — Ambulatory Visit (INDEPENDENT_AMBULATORY_CARE_PROVIDER_SITE_OTHER): Payer: 59 | Admitting: Ophthalmology

## 2014-12-18 DIAGNOSIS — H43813 Vitreous degeneration, bilateral: Secondary | ICD-10-CM | POA: Diagnosis not present

## 2014-12-18 DIAGNOSIS — H33303 Unspecified retinal break, bilateral: Secondary | ICD-10-CM

## 2014-12-28 ENCOUNTER — Other Ambulatory Visit: Payer: Self-pay | Admitting: Internal Medicine

## 2015-02-11 ENCOUNTER — Telehealth: Payer: Self-pay | Admitting: Behavioral Health

## 2015-02-11 ENCOUNTER — Encounter: Payer: Self-pay | Admitting: Behavioral Health

## 2015-02-11 NOTE — Telephone Encounter (Signed)
Pre-Visit Call completed with patient and chart updated.   Pre-Visit Info documented in Specialty Comments under [employer].    

## 2015-02-12 ENCOUNTER — Telehealth: Payer: Self-pay | Admitting: Internal Medicine

## 2015-02-12 ENCOUNTER — Ambulatory Visit (HOSPITAL_BASED_OUTPATIENT_CLINIC_OR_DEPARTMENT_OTHER)
Admission: RE | Admit: 2015-02-12 | Discharge: 2015-02-12 | Disposition: A | Discharge status: Home / Self Care | Payer: 59 | Source: Ambulatory Visit | Attending: Internal Medicine | Admitting: Internal Medicine

## 2015-02-12 ENCOUNTER — Encounter: Payer: Self-pay | Admitting: Internal Medicine

## 2015-02-12 ENCOUNTER — Ambulatory Visit (INDEPENDENT_AMBULATORY_CARE_PROVIDER_SITE_OTHER): Payer: 59 | Admitting: Internal Medicine

## 2015-02-12 VITALS — BP 124/72 | HR 72 | Temp 97.9°F | Ht 74.0 in | Wt 244.1 lb

## 2015-02-12 DIAGNOSIS — M79641 Pain in right hand: Secondary | ICD-10-CM | POA: Insufficient documentation

## 2015-02-12 DIAGNOSIS — Z09 Encounter for follow-up examination after completed treatment for conditions other than malignant neoplasm: Secondary | ICD-10-CM

## 2015-02-12 DIAGNOSIS — M79643 Pain in unspecified hand: Secondary | ICD-10-CM | POA: Diagnosis present

## 2015-02-12 DIAGNOSIS — Z114 Encounter for screening for human immunodeficiency virus [HIV]: Secondary | ICD-10-CM

## 2015-02-12 DIAGNOSIS — Z8546 Personal history of malignant neoplasm of prostate: Secondary | ICD-10-CM | POA: Diagnosis not present

## 2015-02-12 DIAGNOSIS — M189 Osteoarthritis of first carpometacarpal joint, unspecified: Secondary | ICD-10-CM | POA: Insufficient documentation

## 2015-02-12 DIAGNOSIS — M79642 Pain in left hand: Secondary | ICD-10-CM | POA: Diagnosis not present

## 2015-02-12 DIAGNOSIS — M19042 Primary osteoarthritis, left hand: Secondary | ICD-10-CM

## 2015-02-12 DIAGNOSIS — M19041 Primary osteoarthritis, right hand: Secondary | ICD-10-CM

## 2015-02-12 DIAGNOSIS — Z1211 Encounter for screening for malignant neoplasm of colon: Secondary | ICD-10-CM

## 2015-02-12 DIAGNOSIS — Z0000 Encounter for general adult medical examination without abnormal findings: Secondary | ICD-10-CM

## 2015-02-12 DIAGNOSIS — Z1159 Encounter for screening for other viral diseases: Secondary | ICD-10-CM

## 2015-02-12 LAB — CBC WITH DIFFERENTIAL/PLATELET
Basophils Absolute: 0 10*3/uL (ref 0.0–0.1)
Basophils Relative: 0.7 % (ref 0.0–3.0)
EOS ABS: 0.1 10*3/uL (ref 0.0–0.7)
Eosinophils Relative: 2.5 % (ref 0.0–5.0)
HCT: 45.6 % (ref 39.0–52.0)
Hemoglobin: 15.7 g/dL (ref 13.0–17.0)
LYMPHS ABS: 1.6 10*3/uL (ref 0.7–4.0)
Lymphocytes Relative: 29.9 % (ref 12.0–46.0)
MCHC: 34.4 g/dL (ref 30.0–36.0)
MCV: 96.5 fl (ref 78.0–100.0)
MONO ABS: 0.6 10*3/uL (ref 0.1–1.0)
MONOS PCT: 10.6 % (ref 3.0–12.0)
NEUTROS PCT: 56.3 % (ref 43.0–77.0)
Neutro Abs: 3.1 10*3/uL (ref 1.4–7.7)
PLATELETS: 334 10*3/uL (ref 150.0–400.0)
RBC: 4.73 Mil/uL (ref 4.22–5.81)
RDW: 12.4 % (ref 11.5–15.5)
WBC: 5.5 10*3/uL (ref 4.0–10.5)

## 2015-02-12 LAB — BASIC METABOLIC PANEL
BUN: 9 mg/dL (ref 6–23)
CO2: 27 mEq/L (ref 19–32)
Calcium: 9.5 mg/dL (ref 8.4–10.5)
Chloride: 104 mEq/L (ref 96–112)
Creatinine, Ser: 0.67 mg/dL (ref 0.40–1.50)
GFR: 128.81 mL/min (ref >60.00)
GLUCOSE: 92 mg/dL (ref 70–99)
Potassium: 4.5 mEq/L (ref 3.5–5.1)
Sodium: 139 mEq/L (ref 135–145)

## 2015-02-12 LAB — LIPID PANEL
Cholesterol: 202 mg/dL — ABNORMAL HIGH (ref 0–200)
HDL: 40.7 mg/dL (ref >39.00)
NONHDL: 161.3
Total CHOL/HDL Ratio: 5
Triglycerides: 207 mg/dL — ABNORMAL HIGH (ref 0.0–149.0)
VLDL: 41.4 mg/dL — ABNORMAL HIGH (ref 0.0–40.0)

## 2015-02-12 LAB — LDL CHOLESTEROL, DIRECT: LDL DIRECT: 118 mg/dL

## 2015-02-12 LAB — PSA: PSA: 0 ng/mL — AB (ref 0.10–4.00)

## 2015-02-12 LAB — AST: AST: 27 U/L (ref 0–37)

## 2015-02-12 LAB — ALT: ALT: 43 U/L (ref 0–53)

## 2015-02-12 IMAGING — DX DG HAND COMPLETE 3+V*L*
3 series · 3 of 3 positions shown · non-contrast
Comparison: None in PACs

CLINICAL DATA: Bilateral hand pain for the past 3 months without
known injury, history of prostate malignancy.

EXAM:
RIGHT HAND - COMPLETE 3+ VIEW; LEFT HAND - COMPLETE 3+ VIEW

[hand pa]
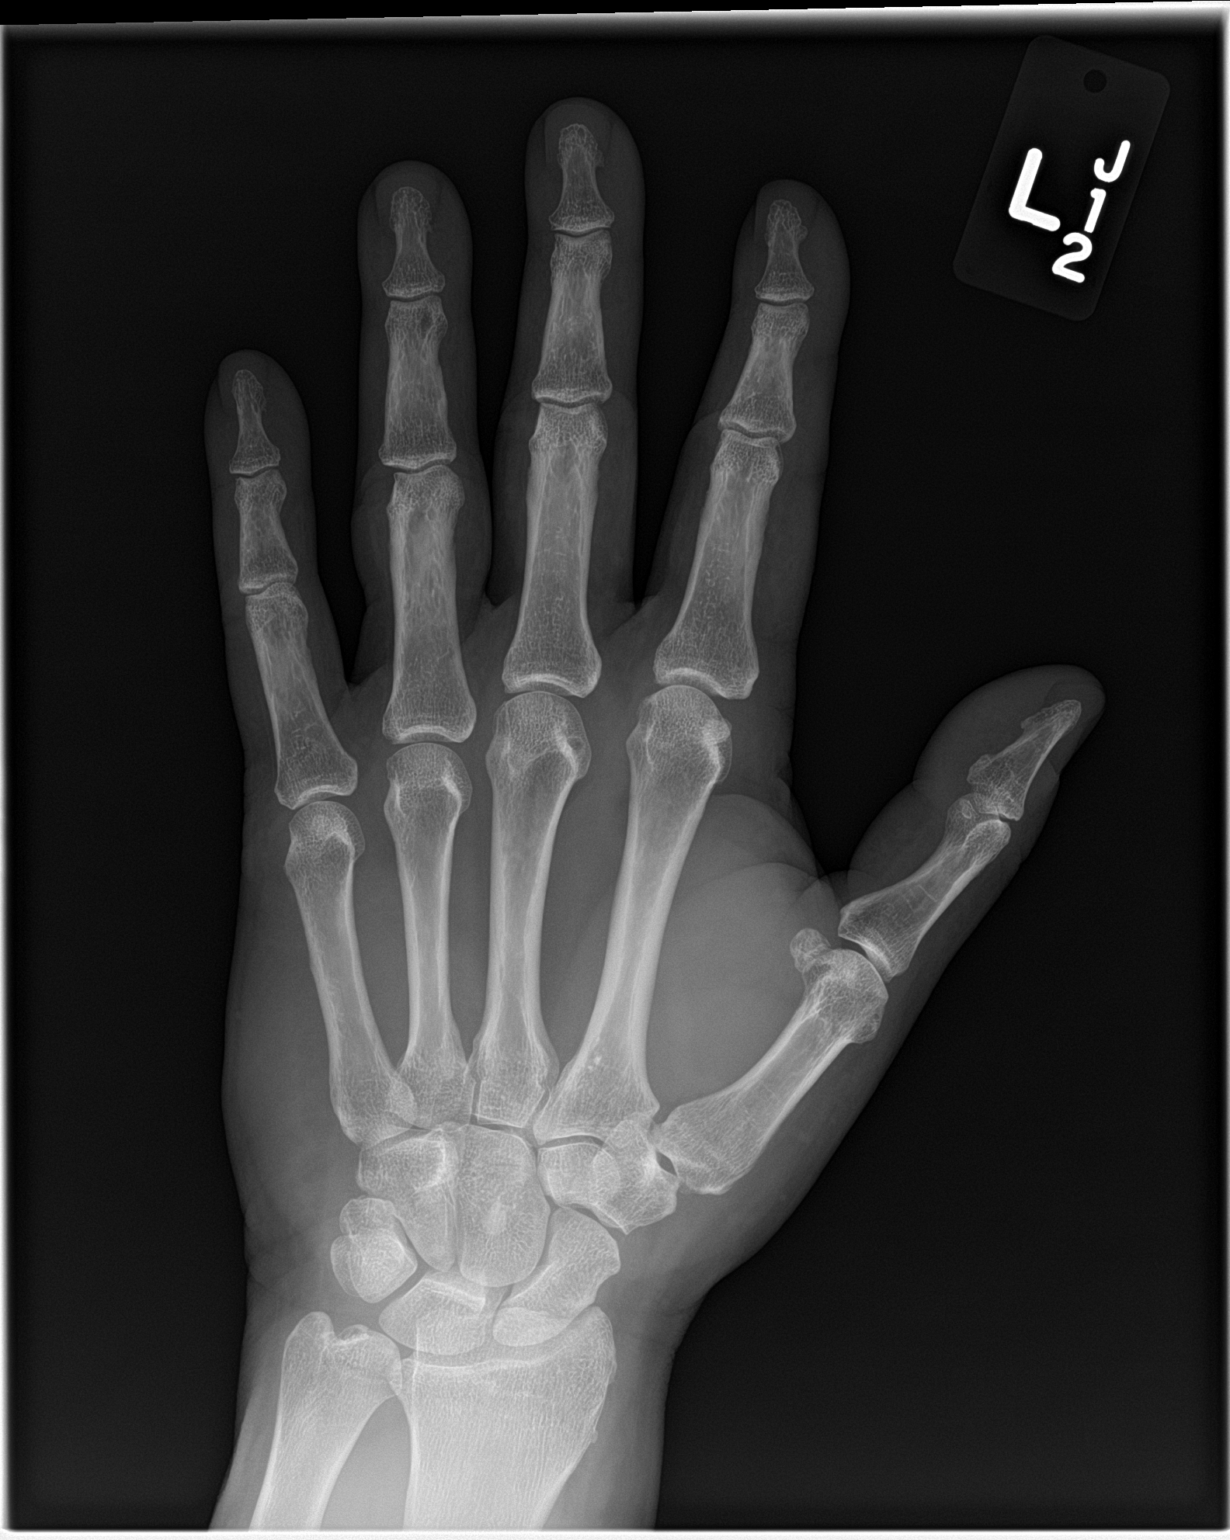

[hand obl]
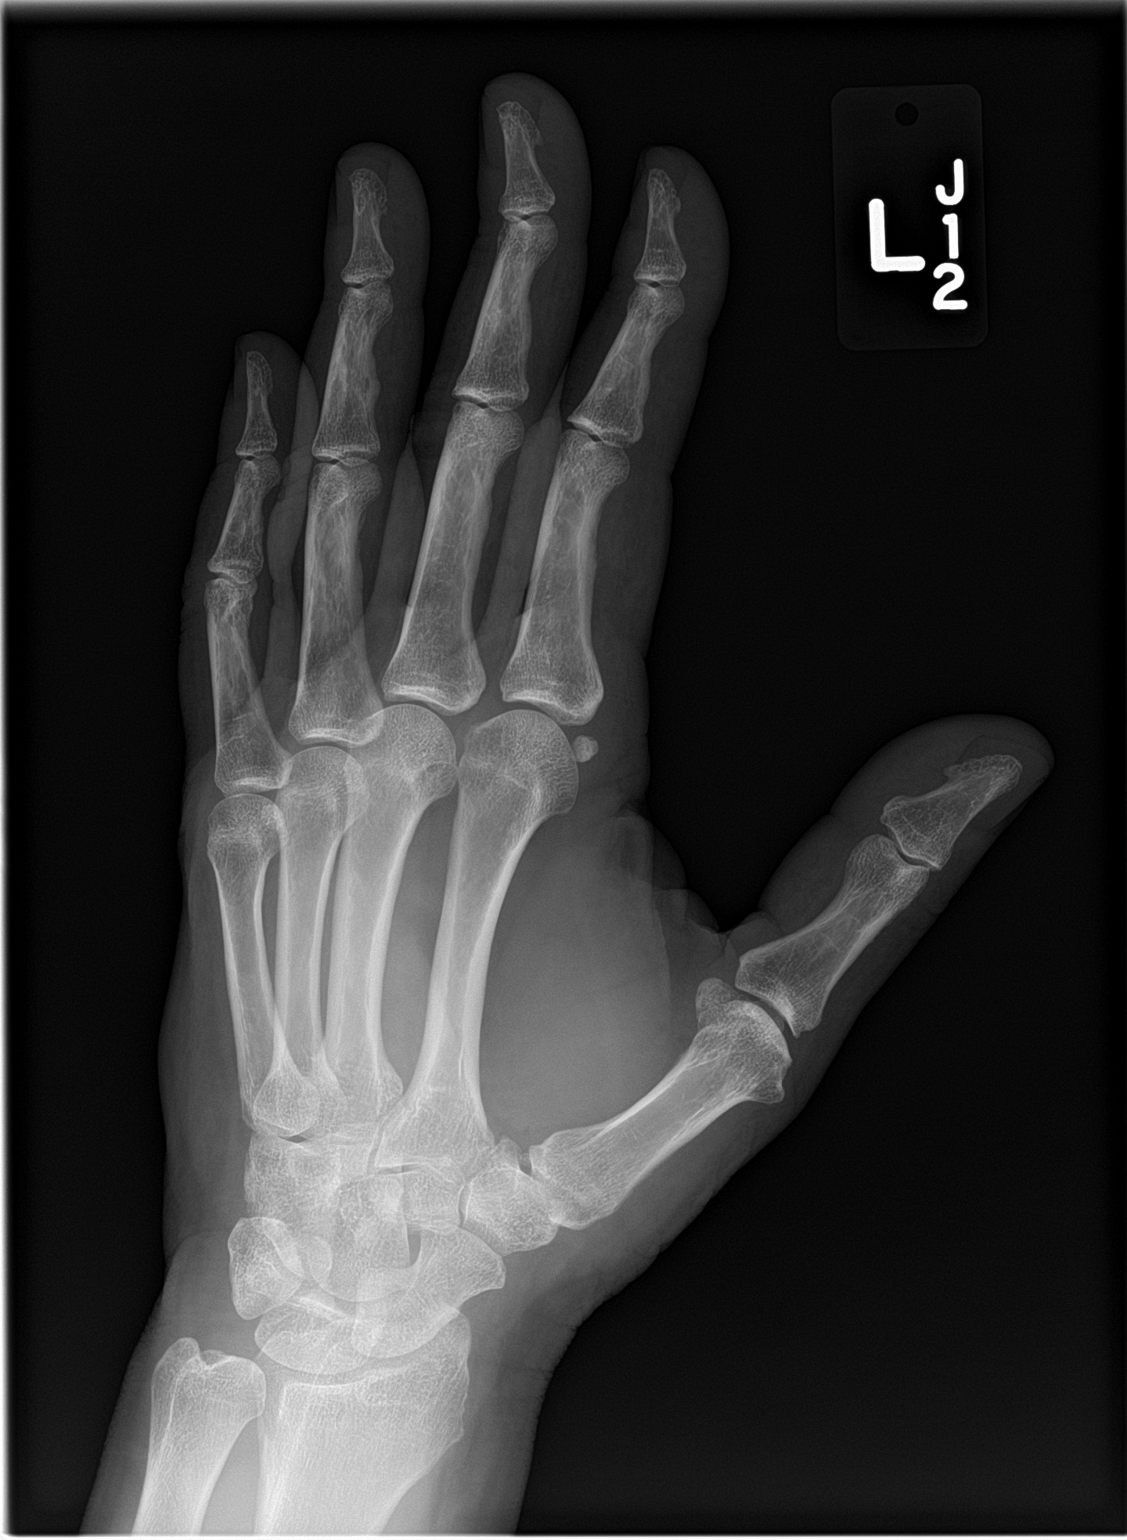

[hand lat]
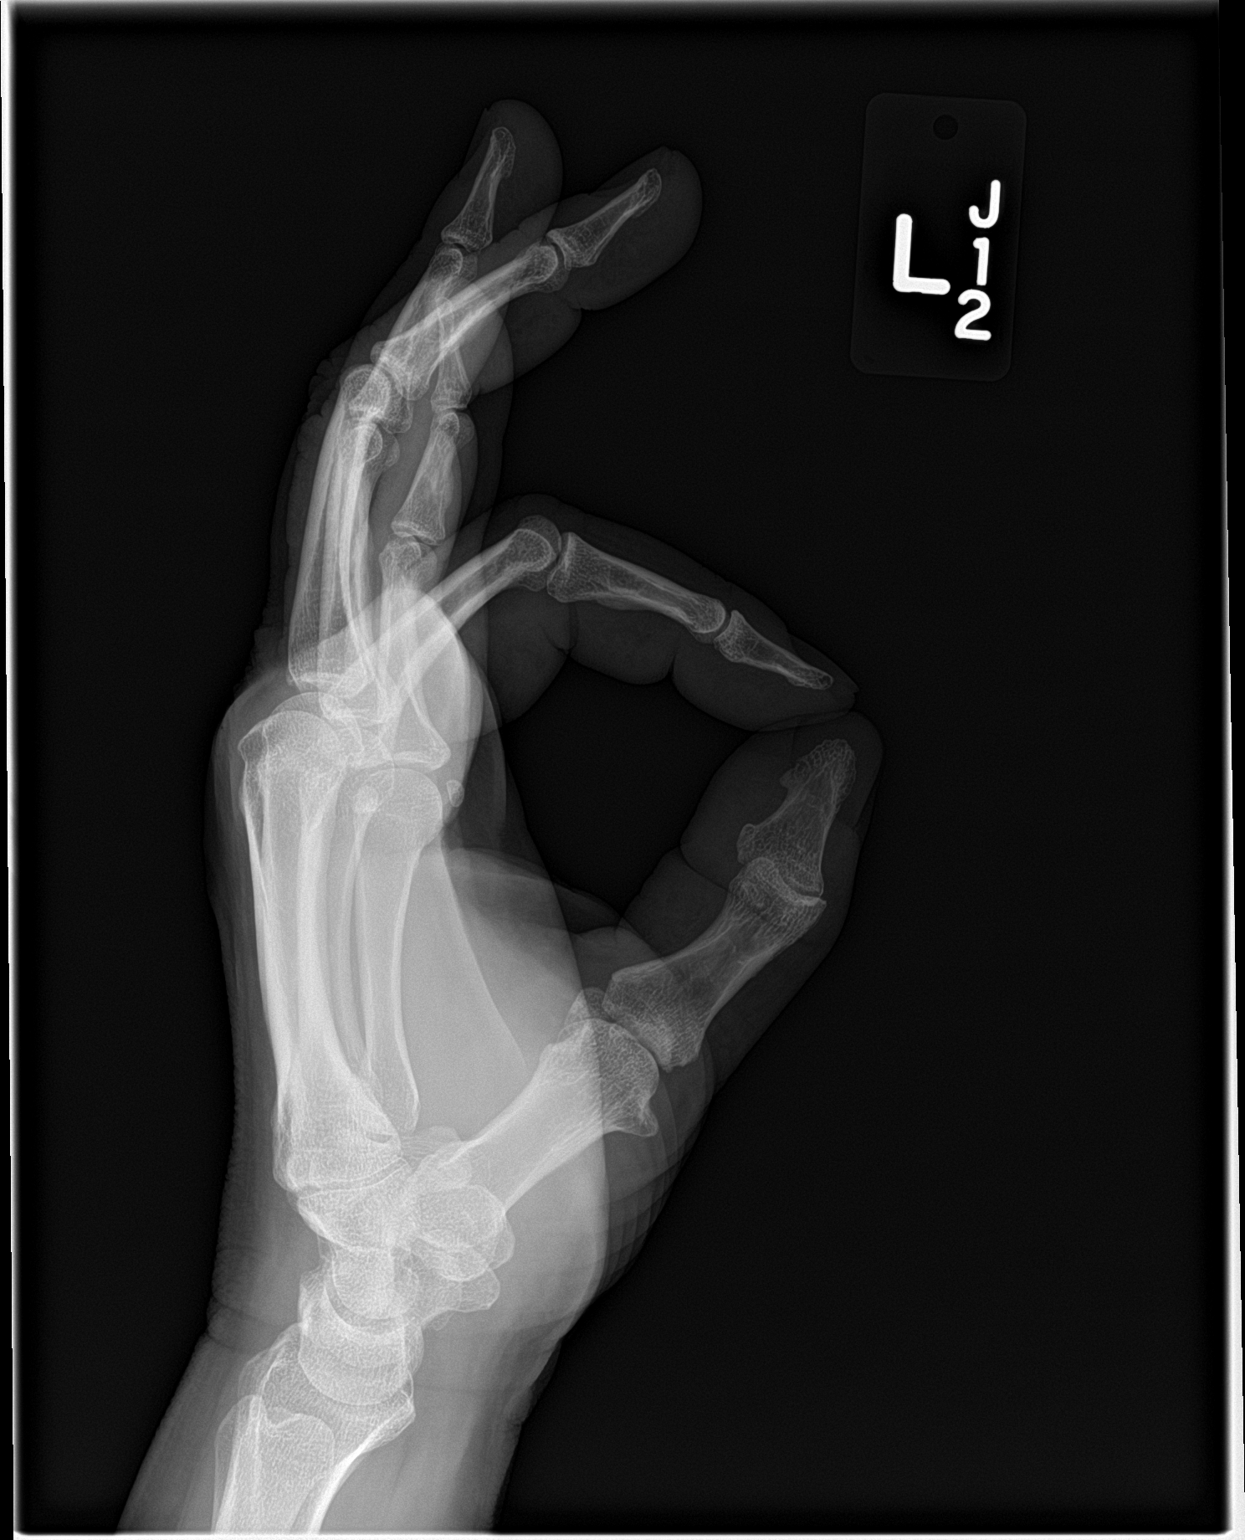

[3 of 3 positions shown; findings below may reference images not displayed]

FINDINGS: Right hand: The bones are adequately mineralized. There is no
juxta-articular osteopenia. The interphalangeal,
metacarpophalangeal, and carpometacarpal joint spaces are preserved.
Minimal osteoarthritic change of the first carpometacarpal joint is
noted. There are no abnormal soft tissue calcifications. There is no
soft tissue swelling. The bones of the wrist are grossly normal.

Left hand: The bones are adequately mineralized. There is no
juxta-articular osteopenia. The interphalangeal,
metacarpophalangeal, and carpometacarpal joint spaces are preserved.
There is minimal degenerative change of the first carpometacarpal
joint. The soft tissues are unremarkable. The observed portions of
the wrist are normal.
IMPRESSION: No acute or old fracture or dislocation is observed. There is
minimal osteoarthritic change of the first CMC joints bilaterally.
Elsewhere the joint spaces are preserved with no significant
proliferative changes. No erosive arthropathy is observed.

## 2015-02-12 NOTE — Progress Notes (Signed)
Subjective:    Patient ID: Donald [name], male    DOB: 1955-07-24, 59 y.o.   MRN: [id number]  DOS:  02/12/2015 Type of visit - description :  CPX Interval history: feeling well, getting a new treatment for ED, working well for him  Review of Systems Constitutional: No fever. No chills. No unexplained wt changes. No unusual sweats  HEENT: No dental problems, no ear discharge, no facial swelling, no voice changes. No eye discharge, no eye  redness , no  intolerance to light   Respiratory: No wheezing , no  difficulty breathing. No cough , no mucus production  Cardiovascular: No CP, no leg swelling , no  Palpitations  GI: no nausea, no vomiting, no diarrhea , no  abdominal pain.  No blood in the stools. No dysphagia, no odynophagia    Endocrine: No polyphagia, no polyuria , no polydipsia  GU: No dysuria, gross hematuria, difficulty urinating. No urinary urgency, no frequency.  Musculoskeletal: No joint swellings but several months history of pain in the index finger bilaterally at the DIP. No morning stiffness, no redness or warmness.  Skin: No change in the color of the skin, palor , no  Rash  Allergic, immunologic: No environmental allergies , no  food allergies  Neurological: No dizziness no  syncope. No headaches. No diplopia, no slurred, no slurred speech, no motor deficits, no facial  Numbness  Hematological: No enlarged lymph nodes, no easy bruising , no unusual bleedings  Psychiatry: No suicidal ideas, no hallucinations, no beavior problems, no confusion.  No unusual/severe anxiety, no depression   Past Medical History  Diagnosis Date  . HNP (herniated nucleus pulposus)     lumbar spine  . Hyperlipidemia     mild  . Prostate cancer ([place])   . ED (erectile dysfunction) 10/08/2011    Following nerve sparing robotic prostatectomy he has decreased tumescence   . Blepharitis   . Cellulitis 2015    Eyelid, Jeralene Huff    Past Surgical History  Procedure Laterality  Date  . Tonsillectomy    . Prostatectomy      robotic '08 at [place]  . Back surgery      ~ 2005    Social History   Social History  . Marital Status: Married    Spouse Name: N/A  . Number of Children: 0  . Years of Education: N/A   Occupational History  . cpa    Social History Main Topics  . Smoking status: Never Smoker   . Smokeless tobacco: Never Used  . Alcohol Use: 3.0 oz/week    6 drink(s) per week  . Drug Use: No  . Sexual Activity:    Partners: Female   Other Topics Concern  . Not on file   Social History Narrative   [employer]. 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    Oldest step dtr died of suicide.    Marriage in good health.     Family History  Problem Relation Age of Onset  . Diabetes Paternal Grandmother   . Prostate cancer Brother   . Stroke Father 66  . Other Father     tobacco abuse  . Alzheimer's disease Mother   . Coronary artery disease Sister 37    MI and sudden deat age 41  . Obesity Sister   . Other Sister     tobacco abuse  . Colon  cancer Neg Hx        Medication List       This list is accurate as of: 02/12/15 11:59 PM.  Always use your most recent med list.               aspirin EC 81 MG tablet  Take 1 tablet (81 mg total) by mouth daily.     cetirizine 10 MG tablet  Commonly known as:  ZYRTEC  Take 10 mg by mouth daily as needed.     lovastatin 20 MG tablet  Commonly known as:  MEVACOR  Take 1 tablet (20 mg total) by mouth at bedtime.     tadalafil 10 MG tablet  Commonly known as:  CIALIS  Take 10 mg by mouth as needed.           Objective:   Physical Exam BP 124/72 mmHg  Pulse 72  Temp(Src) 97.9 F (36.6 C) (Oral)  Ht 6\' 2"  (1.88 m)  Wt 244 lb 2 oz (110.734 kg)  BMI 31.33 kg/m2  SpO2 95% General:   Well developed, well nourished . NAD.  HEENT:  Normocephalic . Face symmetric, atraumatic Neck: No  thyromegaly, normal carotid pulses Lungs:  CTA B Normal respiratory effort, no intercostal retractions, no accessory muscle use. Heart: RRR,  no murmur.  no pretibial edema bilaterally  Abdomen:  Not distended, soft, non-tender. No rebound or rigidity. No mass,organomegaly Skin: Not pale. Not jaundice MSK: Hands and wrists: No synovitis on exam. Neurologic:  alert & oriented X3.  Speech normal, gait appropriate for age and unassisted Psych--  Cognition and judgment appear intact.  Cooperative with normal attention span and concentration.  Behavior appropriate. No anxious or depressed appearing.     Assessment & Plan:   Assessment>  Hyperlipidemia H/o Prostate cancer , robotic prostatectomy 2008, [facility]. No incontinence. F/u per PCP w/ yearly PSAs ED: s/p urology ([facility]) 07-2014, dc cialis, rx local injection, works well, has testost checked H/o HPN, surgery 2005 +FH CAD sister (age 88)  Plan Hyperlipidemia: Labs, continue Mevacor Prostate cancer: Check a PSA ED: Saw urology, cialis was changed to a local injection Trimix, working well for him. F/u q year. DJD?: Pain and index fingers, patient concerned about rheumatoid arthritis, suspect DJD >> XR to ro erosions RTC one year

## 2015-02-12 NOTE — Progress Notes (Signed)
Pre visit review using our clinic review tool, if applicable. No additional management support is needed unless otherwise documented below in the visit note. 

## 2015-02-12 NOTE — Telephone Encounter (Signed)
°  Relation to JO:INOM Call back [place]  Reason for call: pt had appt today and states he forgot to inform Dr. Larose Kells that he needed a rx for lovastatin (MEVACOR) 20 MG tablet, pt would like to know if he can get a 90 day supply.

## 2015-02-12 NOTE — Telephone Encounter (Signed)
Will refill at time of lab results. Awaiting lab results.

## 2015-02-12 NOTE — Patient Instructions (Signed)
Get your blood work before you leave   Stop by the first floor and get the XR      Next visit  for a  physical exam in one year, fasting. Please schedule an appointment at the front desk

## 2015-02-12 NOTE — Assessment & Plan Note (Signed)
Td 2013 colonoscopy at age 59  at [facility], 2 benign polyps request a referral to [place]: done Checking a PSA Labs: BMP, LFTs, FLP, CBC, hep C, HIV, PSA. Diet  and exercise discussed

## 2015-02-13 DIAGNOSIS — Z09 Encounter for follow-up examination after completed treatment for conditions other than malignant neoplasm: Secondary | ICD-10-CM | POA: Insufficient documentation

## 2015-02-13 LAB — HEPATITIS C ANTIBODY: HCV Ab: NEGATIVE

## 2015-02-13 LAB — HIV ANTIBODY (ROUTINE TESTING W REFLEX): HIV: NONREACTIVE

## 2015-02-13 MED ORDER — LOVASTATIN 20 MG PO TABS: 20.0000 mg | ORAL_TABLET | Freq: Every day | ORAL | Status: DC

## 2015-02-13 NOTE — Telephone Encounter (Signed)
Testosterone information below, wrong chart. Please ignore.

## 2015-02-13 NOTE — Telephone Encounter (Signed)
Lovastatin sent to [place], #90 and 3 refills.

## 2015-02-13 NOTE — Telephone Encounter (Signed)
Pt is requesting refill on Testosterone.  Last OV: 10/08/2014  Last Fill: 03/14/2014 #225g and 3RF   Please advise.

## 2015-02-13 NOTE — Telephone Encounter (Signed)
I have never prescribed testosterone, is not in his active medication list. I know that urology checked a testosterone level. If he needs a prescription needs to come from urology

## 2015-02-13 NOTE — Assessment & Plan Note (Signed)
Hyperlipidemia: Labs, continue Mevacor Prostate cancer: Check a PSA ED: Saw urology, cialis was changed to a local injection Trimix, working well for him. F/u q year. DJD?: Pain and index fingers, patient concerned about rheumatoid arthritis, suspect DJD >> XR to ro erosions RTC one year

## 2015-02-25 ENCOUNTER — Other Ambulatory Visit: Payer: Self-pay | Admitting: Internal Medicine

## 2015-03-07 ENCOUNTER — Telehealth: Payer: Self-pay | Admitting: Internal Medicine

## 2015-03-07 NOTE — Telephone Encounter (Signed)
Insurance Josem Kaufmann [phone], lm on vm awaiting return call

## 2015-03-07 NOTE — Telephone Encounter (Signed)
Pt aware.

## 2015-03-07 NOTE — Telephone Encounter (Signed)
Caller name: Self   Can be reached: [phone]   Or [phone]  Reason for call: Requesting referral to [facility] in [facility] for a Colonoscopy on 12/14.

## 2015-03-14 NOTE — Telephone Encounter (Signed)
Referral has been re-faxed.

## 2015-03-14 NOTE — Telephone Encounter (Signed)
Pt called in stating he left msg today and yesterday that we did not send referral to [facility]. He is requesting we send to [facility] at fax # [phone] at [facility] for colonoscopy.

## 2015-03-19 ENCOUNTER — Telehealth: Payer: Self-pay | Admitting: Internal Medicine

## 2015-03-19 NOTE — Telephone Encounter (Signed)
Pt going to Dermatology tomorrow 03/20/15 9:45am and wants to see if he needs new referral. He has a problem that has come up.

## 2015-03-20 NOTE — Telephone Encounter (Signed)
Lm on vm, has current referral placed to Dr Tonia Brooms, Josem Kaufmann [phone] good through 05/16/2015 with 5 visits

## 2015-03-24 LAB — HM COLONOSCOPY

## 2015-08-25 DIAGNOSIS — N393 Stress incontinence (female) (male): Secondary | ICD-10-CM | POA: Insufficient documentation

## 2015-09-15 ENCOUNTER — Ambulatory Visit: Payer: Self-pay | Admitting: Internal Medicine

## 2015-09-18 ENCOUNTER — Ambulatory Visit (INDEPENDENT_AMBULATORY_CARE_PROVIDER_SITE_OTHER): Payer: No Typology Code available for payment source | Admitting: Internal Medicine

## 2015-09-18 ENCOUNTER — Encounter: Payer: Self-pay | Admitting: Internal Medicine

## 2015-09-18 VITALS — BP 118/74 | HR 81 | Temp 97.5°F | Ht 74.0 in | Wt 251.0 lb

## 2015-09-18 DIAGNOSIS — M19042 Primary osteoarthritis, left hand: Secondary | ICD-10-CM | POA: Diagnosis not present

## 2015-09-18 DIAGNOSIS — M19041 Primary osteoarthritis, right hand: Secondary | ICD-10-CM | POA: Diagnosis not present

## 2015-09-18 DIAGNOSIS — M1990 Unspecified osteoarthritis, unspecified site: Secondary | ICD-10-CM | POA: Insufficient documentation

## 2015-09-18 NOTE — Patient Instructions (Signed)
GO TO THE LAB : Get the blood work      [employer]: Please get a ROI for [facility], fax it and request the report of colonoscopy from ~ 03-2015

## 2015-09-18 NOTE — Progress Notes (Signed)
Pre visit review using our clinic review tool, if applicable. No additional management support is needed unless otherwise documented below in the visit note. 

## 2015-09-18 NOTE — Assessment & Plan Note (Signed)
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 Recent x-ray show changes c/w  DJD. Pt is concerned about RA, gout and other conditions. No clinical evidence to support that.  To screen for autoimmune diseases will check RF and ANA. Asked patient to call me if pain persists or is bothersome for a hand specialist referral. RTC 02-2016 , CPX as a schedule

## 2015-09-18 NOTE — Progress Notes (Signed)
Subjective:    Patient ID: Donald [name], male    DOB: [birth year], 60 y.o.   MRN: [id number]  DOS:  09/18/2015 Type of visit - description : acute Interval history: Since the last OV, continue with pain located at the PIP of the index L and R. Denies any redness, puffiness or swelling but whenever he makes a fist, those joints hurt. Very concerned about "rheumatoid arthritis",  like a screening.   Review of Systems  Denies any fever chills or weight loss No elbow, ankle, wrist pain. History of carpal tunnel but currently with no paresthesias.   Past Medical History  Diagnosis Date  . HNP (herniated nucleus pulposus)     lumbar spine  . Hyperlipidemia     mild  . Prostate cancer ([place])   . ED (erectile dysfunction) 10/08/2011    Following nerve sparing robotic prostatectomy he has decreased tumescence   . Blepharitis   . Cellulitis 2015    Eyelid, Jeralene Huff    Past Surgical History  Procedure Laterality Date  . Tonsillectomy    . Prostatectomy      robotic '08 at [place]  . Back surgery      ~ 2005    Social History   Social History  . Marital Status: Married    Spouse Name: N/A  . Number of Children: 0  . Years of Education: N/A   Occupational History  . cpa    Social History Main Topics  . Smoking status: Never Smoker   . Smokeless tobacco: Never Used  . Alcohol Use: 3.0 oz/week    6 drink(s) per week  . Drug Use: No  . Sexual Activity:    Partners: Female   Other Topics Concern  . Not on file   Social History Narrative   [employer]. 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    Oldest step dtr died of suicide.    Marriage in good health.        Medication List       This list is accurate as of: 09/18/15  8:16 PM.  Always use your most recent med list.               aspirin EC 81 MG tablet  Take 1 tablet (81 mg total) by mouth daily.     cetirizine 10 MG tablet  Commonly known as:  ZYRTEC  Take 10 mg by mouth daily as needed.     lovastatin 20 MG tablet  Commonly known as:  MEVACOR  TAKE 1 TABLET BY MOUTH EVERY NIGHT AT BEDTIME     tadalafil 10 MG tablet  Commonly known as:  CIALIS  Take 10 mg by mouth as needed. Reported on 09/18/2015           Objective:   Physical Exam BP 118/74 mmHg  Pulse 81  Temp(Src) 97.5 F (36.4 C) (Oral)  Ht 6\' 2"  (1.88 m)  Wt 251 lb (113.853 kg)  BMI 32.21 kg/m2  SpO2 94% General:   Well developed, well nourished . NAD.  HEENT:  Normocephalic . Face symmetric, atraumatic MSK: Hands and wrists symmetric, normal to inspection and palpation except for very minimal changes of DJD at the PIPs  Elbows normal No SQ nodules throughout the arms. Skin: Not pale. Not jaundice Neurologic:  alert & oriented X3.  Speech normal, gait appropriate for age and unassisted Psych--  Cognition and judgment appear intact.  Cooperative with normal attention span and concentration.  Behavior appropriate. No anxious or depressed appearing.      Assessment & Plan:   Assessment  Hyperlipidemia H/o Prostate cancer , robotic prostatectomy 2008, [facility]. No incontinence. 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 Recent x-ray show changes c/w  DJD. Pt is concerned about RA, gout and other conditions. No clinical evidence to support that.  To screen for autoimmune diseases will check RF and ANA. Asked patient to call me if pain persists or is bothersome for a hand specialist referral. RTC 02-2016 , CPX as a schedule

## 2015-09-19 LAB — RHEUMATOID FACTOR

## 2015-09-19 LAB — ANA: ANA: NEGATIVE

## 2015-09-29 ENCOUNTER — Encounter: Payer: Self-pay | Admitting: Internal Medicine

## 2015-11-26 ENCOUNTER — Encounter: Payer: Self-pay | Admitting: Internal Medicine

## 2015-12-22 ENCOUNTER — Ambulatory Visit (INDEPENDENT_AMBULATORY_CARE_PROVIDER_SITE_OTHER): Payer: 59 | Admitting: Ophthalmology

## 2016-02-18 ENCOUNTER — Ambulatory Visit (INDEPENDENT_AMBULATORY_CARE_PROVIDER_SITE_OTHER): Payer: No Typology Code available for payment source | Admitting: Internal Medicine

## 2016-02-18 ENCOUNTER — Encounter: Payer: Self-pay | Admitting: Internal Medicine

## 2016-02-18 VITALS — BP 116/74 | HR 79 | Temp 98.2°F | Resp 14 | Ht 74.0 in | Wt 241.2 lb

## 2016-02-18 DIAGNOSIS — Z0000 Encounter for general adult medical examination without abnormal findings: Secondary | ICD-10-CM

## 2016-02-18 DIAGNOSIS — Z125 Encounter for screening for malignant neoplasm of prostate: Secondary | ICD-10-CM | POA: Diagnosis not present

## 2016-02-18 DIAGNOSIS — Z23 Encounter for immunization: Secondary | ICD-10-CM

## 2016-02-18 LAB — BASIC METABOLIC PANEL
BUN: 10 mg/dL (ref 6–23)
CHLORIDE: 105 meq/L (ref 96–112)
CO2: 27 meq/L (ref 19–32)
CREATININE: 0.7 mg/dL (ref 0.40–1.50)
Calcium: 9.3 mg/dL (ref 8.4–10.5)
GFR: 122.04 mL/min (ref >60.00)
Glucose, Bld: 104 mg/dL — ABNORMAL HIGH (ref 70–99)
POTASSIUM: 4.6 meq/L (ref 3.5–5.1)
Sodium: 139 mEq/L (ref 135–145)

## 2016-02-18 LAB — LIPID PANEL
Cholesterol: 196 mg/dL (ref 0–200)
HDL: 46.9 mg/dL (ref >39.00)
LDL Cholesterol: 123 mg/dL — ABNORMAL HIGH (ref 0–99)
NonHDL: 149.4
TRIGLYCERIDES: 133 mg/dL (ref 0.0–149.0)
Total CHOL/HDL Ratio: 4
VLDL: 26.6 mg/dL (ref 0.0–40.0)

## 2016-02-18 LAB — ALT: ALT: 38 U/L (ref 0–53)

## 2016-02-18 LAB — PSA: PSA: 0 ng/mL — ABNORMAL LOW (ref 0.10–4.00)

## 2016-02-18 LAB — TSH: TSH: 0.75 u[IU]/mL (ref 0.35–4.50)

## 2016-02-18 LAB — AST: AST: 30 U/L (ref 0–37)

## 2016-02-18 NOTE — Assessment & Plan Note (Addendum)
Td 2013;  Flu shot today 1st cscope (-), 2nd Cscope  03/24/2015, Dr. Ferdinand Lango. Tubular adenoma. Rx to re-do cscope in 3 years due to personal history of colon polyps and inadequate prep. History of prostate cancer, on yearly PSAs. Blood work today. Doing great with lifestyle, has lost weight. Prevacid. Labs: BMP, AST, ALT, FLP, TSH, PSA RTC one year

## 2016-02-18 NOTE — Progress Notes (Signed)
Pre visit review using our clinic review tool, if applicable. No additional management support is needed unless otherwise documented below in the visit note. 

## 2016-02-18 NOTE — Assessment & Plan Note (Signed)
Hyperlipidemia: On Mevacor. Checking labs ED: Uses local injections sporadically. RTC one year

## 2016-02-18 NOTE — Patient Instructions (Signed)
GO TO THE LAB : Get the blood work     GO TO THE FRONT DESK Schedule your next appointment for a  Physical exam in 1 year 

## 2016-02-18 NOTE — Progress Notes (Signed)
Subjective:    Patient ID: Donald [name], male    DOB: [birth year], 60 y.o.   MRN: [id number]  DOS:  02/18/2016 Type of visit - description : cpx Interval history:Feeling well, no concerns, doing better with diet and exercise  Wt Readings from Last 3 Encounters:  02/18/16 241 lb 4 oz (109.4 kg)  09/18/15 251 lb (113.9 kg)  02/12/15 244 lb 2 oz (110.7 kg)     Review of Systems Constitutional: No fever. No chills. No unexplained wt changes. No unusual sweats  HEENT: No dental problems, no ear discharge, no facial swelling, no voice changes. No eye discharge, no eye  redness , no  intolerance to light   Respiratory: No wheezing , no  difficulty breathing. No cough , no mucus production  Cardiovascular: No CP, no leg swelling , no  Palpitations  GI: no nausea, no vomiting, no diarrhea , no  abdominal pain.  No blood in the stools. No dysphagia, no odynophagia    Endocrine: No polyphagia, no polyuria , no polydipsia  GU: No dysuria, gross hematuria, difficulty urinating. No urinary urgency, no frequency.  Musculoskeletal: No joint swellings or unusual aches or pains  Skin: No change in the color of the skin, palor, occasional psoriasis flareups Allergic, immunologic: No environmental allergies , no  food allergies  Neurological: No dizziness no  syncope. No headaches. No diplopia, no slurred, no slurred speech, no motor deficits, no facial  Numbness  Hematological: No enlarged lymph nodes, no easy bruising , no unusual bleedings  Psychiatry: No suicidal ideas, no hallucinations, no beavior problems, no confusion.  No unusual/severe anxiety, no depression   Past Medical History:  Diagnosis Date  . Blepharitis   . Cellulitis 2015   Eyelid, Jeralene Huff  . ED (erectile dysfunction) 10/08/2011   Following nerve sparing robotic prostatectomy he has decreased tumescence   . HNP (herniated nucleus pulposus)    lumbar spine  . Hyperlipidemia    mild  . Prostate cancer [facility])      Past Surgical History:  Procedure Laterality Date  . BACK SURGERY     ~ 2005  . PROSTATECTOMY     robotic '08 at [place]  . TONSILLECTOMY      Social History   Social History  . Marital status: Married    Spouse name: N/A  . Number of children: 0  . Years of education: N/A   Occupational History  . cpa     Social History Main Topics  . Smoking status: Never Smoker  . Smokeless tobacco: Never Used  . Alcohol use 3.0 oz/week    6 drink(s) per week  . Drug use: No  . Sexual activity: Yes    Partners: Female   Other Topics Concern  . Not on file   Social History Narrative   [employer]. 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    Oldest step dtr died of suicide.    Marriage in good health.     Family History  Problem Relation Age of Onset  . Stroke Father 62  . Other Father     tobacco abuse  . Alzheimer's disease Mother   . Coronary artery disease Sister 50    MI and sudden deat age 105  . Obesity Sister   . Other Sister     tobacco abuse  . Diabetes Paternal Grandmother   . Prostate cancer  Brother   . Colon cancer Neg Hx        Medication List       Accurate as of 02/18/16  1:08 PM. Always use your most recent med list.          aspirin EC 81 MG tablet Take 1 tablet (81 mg total) by mouth daily.   cetirizine 10 MG tablet Commonly known as:  ZYRTEC Take 10 mg by mouth daily as needed.   lovastatin 20 MG tablet Commonly known as:  MEVACOR TAKE 1 TABLET BY MOUTH EVERY NIGHT AT BEDTIME   MUSE UR Place into the urethra. Injection prn          Objective:   Physical Exam BP 116/74 (BP Location: Left Arm, Patient Position: Sitting, Cuff Size: Normal)   Pulse 79   Temp 98.2 F (36.8 C) (Oral)   Resp 14   Ht 6\' 2"  (1.88 m)   Wt 241 lb 4 oz (109.4 kg)   SpO2 98%   BMI 30.97 kg/m   General:   Well developed, well nourished . NAD.  Neck: No   thyromegaly  HEENT:  Normocephalic . Face symmetric, atraumatic Lungs:  CTA B Normal respiratory effort, no intercostal retractions, no accessory muscle use. Heart: RRR,  no murmur.  No pretibial edema bilaterally  Abdomen:  Not distended, soft, non-tender. No rebound or rigidity.   Skin: Exposed areas without rash. Not pale. Not jaundice Neurologic:  alert & oriented X3.  Speech normal, gait appropriate for age and unassisted Strength symmetric and appropriate for age.  Psych: Cognition and judgment appear intact.  Cooperative with normal attention span and concentration.  Behavior appropriate. No anxious or depressed appearing.    Assessment & Plan:   Assessment  Hyperlipidemia H/o psoriasis , sees derm prn; occasional flareup, groins H/o Prostate cancer , robotic prostatectomy 2008, [facility]. No incontinence. F/u per PCP w/ yearly PSAs ED: s/p urology ([facility]) 07-2014, dc cialis, rx local injection, works well, has testost checked H/o HPN, surgery 2005 +FH CAD sister (age 64)  PLAN: Hyperlipidemia: On Mevacor. Checking labs ED: Uses local injections sporadically. RTC one year

## 2016-03-16 ENCOUNTER — Other Ambulatory Visit: Payer: Self-pay | Admitting: Internal Medicine

## 2017-05-23 ENCOUNTER — Encounter: Payer: Self-pay | Admitting: Internal Medicine

## 2017-05-23 ENCOUNTER — Ambulatory Visit (INDEPENDENT_AMBULATORY_CARE_PROVIDER_SITE_OTHER): Payer: No Typology Code available for payment source | Admitting: Internal Medicine

## 2017-05-23 VITALS — BP 128/68 | HR 63 | Temp 97.6°F | Resp 14 | Ht 74.0 in | Wt 241.1 lb

## 2017-05-23 DIAGNOSIS — Z8546 Personal history of malignant neoplasm of prostate: Secondary | ICD-10-CM | POA: Diagnosis not present

## 2017-05-23 DIAGNOSIS — Z0000 Encounter for general adult medical examination without abnormal findings: Secondary | ICD-10-CM | POA: Diagnosis not present

## 2017-05-23 DIAGNOSIS — G629 Polyneuropathy, unspecified: Secondary | ICD-10-CM

## 2017-05-23 LAB — LIPID PANEL
Cholesterol: 197 mg/dL (ref 0–200)
HDL: 30.2 mg/dL — AB (ref >39.00)
LDL Cholesterol: 134 mg/dL — ABNORMAL HIGH (ref 0–99)
NONHDL: 166.8
TRIGLYCERIDES: 162 mg/dL — AB (ref 0.0–149.0)
Total CHOL/HDL Ratio: 7
VLDL: 32.4 mg/dL (ref 0.0–40.0)

## 2017-05-23 LAB — CBC WITH DIFFERENTIAL/PLATELET
BASOS PCT: 0.4 % (ref 0.0–3.0)
Basophils Absolute: 0 10*3/uL (ref 0.0–0.1)
EOS PCT: 2.1 % (ref 0.0–5.0)
Eosinophils Absolute: 0.1 10*3/uL (ref 0.0–0.7)
HCT: 44.5 % (ref 39.0–52.0)
Hemoglobin: 15.2 g/dL (ref 13.0–17.0)
LYMPHS ABS: 1.6 10*3/uL (ref 0.7–4.0)
Lymphocytes Relative: 29.2 % (ref 12.0–46.0)
MCHC: 34.2 g/dL (ref 30.0–36.0)
MCV: 97.6 fl (ref 78.0–100.0)
MONO ABS: 0.5 10*3/uL (ref 0.1–1.0)
MONOS PCT: 9.1 % (ref 3.0–12.0)
NEUTROS ABS: 3.2 10*3/uL (ref 1.4–7.7)
NEUTROS PCT: 59.2 % (ref 43.0–77.0)
PLATELETS: 430 10*3/uL — AB (ref 150.0–400.0)
RBC: 4.56 Mil/uL (ref 4.22–5.81)
RDW: 12.2 % (ref 11.5–15.5)
WBC: 5.4 10*3/uL (ref 4.0–10.5)

## 2017-05-23 LAB — PSA: PSA: 0 ng/mL — ABNORMAL LOW (ref 0.10–4.00)

## 2017-05-23 LAB — COMPREHENSIVE METABOLIC PANEL
ALT: 37 U/L (ref 0–53)
AST: 23 U/L (ref 0–37)
Albumin: 4.4 g/dL (ref 3.5–5.2)
Alkaline Phosphatase: 48 U/L (ref 39–117)
BILIRUBIN TOTAL: 0.8 mg/dL (ref 0.2–1.2)
BUN: 13 mg/dL (ref 6–23)
CHLORIDE: 105 meq/L (ref 96–112)
CO2: 26 meq/L (ref 19–32)
Calcium: 8.8 mg/dL (ref 8.4–10.5)
Creatinine, Ser: 0.71 mg/dL (ref 0.40–1.50)
GFR: 119.56 mL/min (ref >60.00)
GLUCOSE: 106 mg/dL — AB (ref 70–99)
Potassium: 4.4 mEq/L (ref 3.5–5.1)
Sodium: 139 mEq/L (ref 135–145)
Total Protein: 7.1 g/dL (ref 6.0–8.3)

## 2017-05-23 LAB — SEDIMENTATION RATE: Sed Rate: 14 mm/hr (ref 0–20)

## 2017-05-23 LAB — TSH: TSH: 1.06 u[IU]/mL (ref 0.35–4.50)

## 2017-05-23 LAB — FOLATE: Folate: 20.1 ng/mL (ref >5.9)

## 2017-05-23 LAB — VITAMIN B12: Vitamin B-12: 978 pg/mL — ABNORMAL HIGH (ref 211–911)

## 2017-05-23 NOTE — Progress Notes (Signed)
Pre visit review using our clinic review tool, if applicable. No additional management support is needed unless otherwise documented below in the visit note. 

## 2017-05-23 NOTE — Assessment & Plan Note (Signed)
Hyperlipidemia: Ran out of Mevacor 6 months ago, doing great with diet and exercise, check FLP, he will be okay to go back on statins if needed. ED: Last visit with urology for injections prescription was 2018 Paresthesias: Neuropathy?  Needs further eval, will check a H47, folic acid, vitamin D, RPR and sed rate.  Refer to neurology RTC 1 year

## 2017-05-23 NOTE — Assessment & Plan Note (Addendum)
-  Td 2013;  had a Flu shot  -CCS: 1st cscope (-), 2nd Cscope  03/24/2015, Dr. Ferdinand Lango. Tubular adenoma. Rx to re-do cscope in 3 years due to personal history of colon polyps and inadequate prep.  Will call in few months to set an appointment, likes to see [facility] - h/o prostate cancer, on yearly PSAs.  . -Doing great with lifestyle, has lost weight, encouraged to continue! -Labs:   X91, folic acid, vitamin D, RPR, sed rate, CMP, FLP, CBC, TSH, PSA RTC one year

## 2017-05-23 NOTE — Progress Notes (Signed)
Subjective:    Patient ID: OMA ALPERT, male    DOB: June 17, 1955, 62 y.o.   MRN: [id number]  DOS:  05/23/2017 Type of visit - description : CPX Interval history: General feeling well, he does have some issues.   Review of Systems  Report numbness at the lower extremities for 2-3 years. Started in the right toes and now is in the toes bilaterally and some at the external side of the right foot. No worse at night but they numb feeling is persistent. Denies any difficulty with motor weaknesses or extremities coordination Has occasional neck and back pain which is nothing new to him.  Other than above, a 14 point review of systems is negative      Past Medical History:  Diagnosis Date  . Blepharitis   . Cellulitis 2015   Eyelid, Jeralene Huff  . ED (erectile dysfunction) 10/08/2011   Following nerve sparing robotic prostatectomy he has decreased tumescence   . HNP (herniated nucleus pulposus)    lumbar spine  . Hyperlipidemia    mild  . Prostate cancer [facility])     Past Surgical History:  Procedure Laterality Date  . BACK SURGERY     ~ 2005  . PROSTATECTOMY     robotic '08 at [place]  . TONSILLECTOMY      Social History   Socioeconomic History  . Marital status: Married    Spouse name: Not on file  . Number of children: 0  . Years of education: Not on file  . Highest education level: Not on file  Social Needs  . Financial resource strain: Not on file  . Food insecurity - worry: Not on file  . Food insecurity - inability: Not on file  . Transportation needs - medical: Not on file  . Transportation needs - non-medical: Not on file  Occupational History  . Occupation: cpa   Tobacco Use  . Smoking status: Never Smoker  . Smokeless tobacco: Never Used  Substance and Sexual Activity  . Alcohol use: Yes    Alcohol/week: 3.0 oz    Types: 6 drink(s) per week  . Drug use: No  . Sexual activity: Yes    Partners: Female  Other Topics Concern  . Not on file  Social  History Narrative   [employer]. 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    Oldest step dtr died of suicide.    Marriage in good health.     Family History  Problem Relation Age of Onset  . Stroke Father 65  . Other Father        tobacco abuse  . Alzheimer's disease Mother   . Coronary artery disease Sister 15       MI and sudden deat age 74  . Obesity Sister   . Other Sister        tobacco abuse  . Diabetes Paternal Grandmother   . Prostate cancer Brother   . Lymphoma Brother        ?? gastric  . Colon cancer Neg Hx      Allergies as of 05/23/2017   No Known Allergies     Medication List        Accurate as of 05/23/17  9:46 PM. Always use your most recent med list.          aspirin EC 81 MG tablet Take 1 tablet (81 mg total)  by mouth daily.   cetirizine 10 MG tablet Commonly known as:  ZYRTEC Take 10 mg by mouth daily as needed.   MUSE UR Place into the urethra. Injection prn          Objective:   Physical Exam BP 128/68 (BP Location: Left Arm, Patient Position: Sitting, Cuff Size: Normal)   Pulse 63   Temp 97.6 F (36.4 C) (Oral)   Resp 14   Ht 6\' 2"  (1.88 m)   Wt 241 lb 2 oz (109.4 kg)   SpO2 93%   BMI 30.96 kg/m   General:   Well developed, well nourished . NAD.  Neck: No  thyromegaly  HEENT:  Normocephalic . Face symmetric, atraumatic Lungs:  CTA B Normal respiratory effort, no intercostal retractions, no accessory muscle use. Heart: RRR,  no murmur.  No pretibial edema bilaterally. Normal pedal pulses MSK: feet w/ very high arches noted Abdomen:  Not distended, soft, non-tender. No rebound or rigidity.   Skin: Exposed areas without rash. Not pale. Not jaundice Neurologic:  alert & oriented X3.  Speech normal, gait appropriate for age and unassisted Strength symmetric and appropriate for age. DTR symmetric. Pinprick examination feet:  slightly decreased sensitivity @ distal  plantar area , toes actually ok Psych: Cognition and judgment appear intact.  Cooperative with normal attention span and concentration.  Behavior appropriate. No anxious or depressed appearing.     Assessment & Plan:   Assessment  Hyperlipidemia H/o psoriasis , sees derm prn; occasional flareup, groins GU: -H/o Prostate cancer , robotic prostatectomy 2008, [facility]. No incontinence. F/u per PCP w/ yearly PSAs -ED: s/p urology ([facility]) 07-2014, dc cialis, rx local injection, works well, has testost checked - h/o stress incontinence  H/o HPN, surgery 2005 +FH CAD sister (age 20)  PLAN: Hyperlipidemia: Ran out of Mevacor 6 months ago, doing great with diet and exercise, check FLP, he will be okay to go back on statins if needed. ED: Last visit with urology for injections prescription was 2018 Paresthesias: Neuropathy?  Needs further eval, will check a E31, folic acid, vitamin D, RPR and sed rate.  Refer to neurology RTC 1 year

## 2017-05-23 NOTE — Patient Instructions (Signed)
GO TO THE LAB : Get the blood work     GO TO THE FRONT DESK Schedule your next appointment for a physical exam in 1 year  We are referring you to neurology  Call in few months and request gastroenterology referral for your next colonoscopy

## 2017-05-26 LAB — VITAMIN D 1,25 DIHYDROXY
VITAMIN D 1, 25 (OH) TOTAL: 56 pg/mL (ref 18–72)
Vitamin D2 1, 25 (OH)2: <8 pg/mL
Vitamin D3 1, 25 (OH)2: 56 pg/mL

## 2017-05-26 LAB — RPR: RPR Ser Ql: NONREACTIVE

## 2018-04-10 ENCOUNTER — Telehealth: Payer: Self-pay

## 2018-04-10 DIAGNOSIS — Z1211 Encounter for screening for malignant neoplasm of colon: Secondary | ICD-10-CM

## 2018-04-10 NOTE — Telephone Encounter (Signed)
-----   Message from Colon Branch, MD sent at 04/07/2018  6:04 PM EST ----- Regarding: GI referral Due for a colonoscopy, likes that to be done at [facility].  Enter a referral, needs a office visit because he is a new patient to them,   let the patient know that we are sending him there. JP

## 2018-05-05 ENCOUNTER — Encounter: Payer: Self-pay | Admitting: Internal Medicine

## 2018-09-12 ENCOUNTER — Ambulatory Visit (INDEPENDENT_AMBULATORY_CARE_PROVIDER_SITE_OTHER): Payer: No Typology Code available for payment source | Admitting: Family Medicine

## 2018-09-12 ENCOUNTER — Other Ambulatory Visit: Payer: Self-pay

## 2018-09-12 ENCOUNTER — Encounter: Payer: Self-pay | Admitting: Family Medicine

## 2018-09-12 ENCOUNTER — Ambulatory Visit: Payer: Self-pay

## 2018-09-12 VITALS — BP 128/82 | HR 85 | Ht 74.0 in | Wt 253.0 lb

## 2018-09-12 DIAGNOSIS — M79671 Pain in right foot: Secondary | ICD-10-CM

## 2018-09-12 DIAGNOSIS — M7661 Achilles tendinitis, right leg: Secondary | ICD-10-CM | POA: Insufficient documentation

## 2018-09-12 DIAGNOSIS — M48062 Spinal stenosis, lumbar region with neurogenic claudication: Secondary | ICD-10-CM

## 2018-09-12 MED ORDER — GABAPENTIN 100 MG PO CAPS: 200.0000 mg | ORAL_CAPSULE | Freq: Every day | ORAL | 3 refills | Status: DC

## 2018-09-12 MED ORDER — DICLOFENAC SODIUM 2 % TD SOLN: 2.0000 g | Freq: Two times a day (BID) | TRANSDERMAL | 3 refills | Status: DC

## 2018-09-12 MED ORDER — NITROGLYCERIN 0.2 MG/HR TD PT24: MEDICATED_PATCH | TRANSDERMAL | 0 refills | Status: DC

## 2018-09-12 NOTE — Patient Instructions (Addendum)
Good to see you  Ice 20 minutes 2 times daily. Usually after activity and before bed. Exercises 3 times a week.  Heel lift 1/8 inch Tart cherry 1200 mg at night Nitroglycerin Protocol   Apply 1/4 nitroglycerin patch to affected area daily.  Change position of patch within the affected area every 24 hours.  You may experience a headache during the first 1-2 weeks of using the patch, these should subside.  If you experience headaches after beginning nitroglycerin patch treatment, you may take your preferred over the counter pain reliever.  Another side effect of the nitroglycerin patch is skin irritation or rash related to patch adhesive.  Please notify our office if you develop more severe headaches or rash, and stop the patch.  Tendon healing with nitroglycerin patch may require 12 to 24 weeks depending on the extent of injury.  Men should not use if taking Viagra, Cialis, or Levitra.   Do not use if you have migraines or rosacea.  See me in 4-5 weeks

## 2018-09-12 NOTE — Assessment & Plan Note (Signed)
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  Home exercises have been given previously.  Discussed core strengthening.  Gabapentin 200 mg at night.

## 2018-09-12 NOTE — Assessment & Plan Note (Signed)
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  Possibility of gouty deposits noted as well.  We discussed with patient in great length.  Discussed home exercises and icing regimen topical anti-inflammatories, nitroglycerin protocol.  We discussed which activities to do which wants to avoid.  Discussed heel lift.  Follow-up again 4 to 6 weeks

## 2018-09-12 NOTE — Progress Notes (Signed)
[name] [address] [place], [place] [address] Phone: [phone] Subjective:   Donald Donald [name], am serving as a scribe for Dr. Hulan [name].  I'm seeing this patient by the request  of:  Donald Branch, MD   CC: Right ankle pain  [id number]  Donald Donald [name] is a 63 y.o. male coming in with complaint of right achilles pain for years. Pain increases with weight bearing activities. Did have pain last night in bed. Was not on feet more yesterday. Did walk 3 miles last week and was in pain. Does have Good Feet inserts that do seem to help pain. Uses IBU prn.   Also complaining of numbness in both feet. Had MRI of heel and back one year ago.  Patient's MRI of the lumbar spine was independently visualized by me.  Found to have some spinal stenosis with significant amount of degenerative disc disease and possible foraminal narrowing.     Past Medical History:  Diagnosis Date  . Blepharitis   . Cellulitis 2015   Eyelid, Donald Donald [name]  . ED (erectile dysfunction) 10/08/2011   Following nerve sparing robotic prostatectomy he has decreased tumescence   . HNP (herniated nucleus pulposus)    lumbar spine  . Hyperlipidemia    mild  . Prostate cancer [facility])    Past Surgical History:  Procedure Laterality Date  . BACK SURGERY     ~ 2005  . PROSTATECTOMY     robotic '08 at [place]  . TONSILLECTOMY     Social History   Socioeconomic History  . Marital status: Married    Spouse name: Not on file  . Number of children: 0  . Years of education: Not on file  . Highest education level: Not on file  Occupational History  . Occupation: cpa   Social Needs  . Financial resource strain: Not on file  . Food insecurity:    Worry: Not on file    Inability: Not on file  . Transportation needs:    Medical: Not on file    Non-medical: Not on file  Tobacco Use  . Smoking status: Never Smoker  . Smokeless tobacco: Never Used  Substance and Sexual Activity  .  Alcohol use: Yes    Alcohol/week: 6.0 standard drinks    Types: 6 drink(s) per week  . Drug use: Donald [name]  . Sexual activity: Yes    Partners: Female  Lifestyle  . Physical activity:    Days per week: Not on file    Minutes per session: Not on file  . Stress: Not on file  Relationships  . Social connections:    Talks on phone: Not on file    Gets together: Not on file    Attends religious service: Not on file    Active member of club or organization: Not on file    Attends meetings of clubs or organizations: Not on file    Relationship status: Not on file  Other Topics Concern  . Not on file  Social History Narrative   [employer]. 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    Oldest step dtr died of suicide.    Marriage in good health.   Donald [name] Known Allergies Family History  Problem Relation Age of Onset  . Stroke Father 41  . Other Father        tobacco abuse  .  Alzheimer's disease Mother   . Coronary artery disease Sister 52       MI and sudden deat age 79  . Obesity Sister   . Other Sister        tobacco abuse  . Diabetes Paternal Grandmother   . Prostate cancer Brother   . Lymphoma Brother        ?? gastric  . Donald cancer Neg Hx      Current Outpatient Medications (Cardiovascular):  Marland Kitchen  Alprostadil, Vasodilator, (MUSE UR), Place into the urethra. Injection prn .  nitroGLYCERIN (NITRO-DUR) 0.2 mg/hr patch, Apply 1/4 of a patch to skin once daily.  Current Outpatient Medications (Respiratory):  .  cetirizine (ZYRTEC) 10 MG tablet, Take 10 mg by mouth daily as needed.    Current Outpatient Medications (Analgesics):  .  aspirin EC 81 MG tablet, Take 1 tablet (81 mg total) by mouth daily.   Current Outpatient Medications (Other):  Marland Kitchen  Diclofenac Sodium 2 % SOLN, Place 2 g onto the skin 2 (two) times daily. Marland Kitchen  gabapentin (NEURONTIN) 100 MG capsule, Take 2 capsules (200 mg total)  by mouth at bedtime.    Past medical history, social, surgical and family history all reviewed in electronic medical record.  Donald [name] pertanent information unless stated regarding to the chief complaint.   Review of Systems:  Donald [name] headache, visual changes, nausea, vomiting, diarrhea, constipation, dizziness, abdominal pain, skin rash, fevers, chills, night sweats, weight loss, swollen lymph nodes, body aches, joint swelling, muscle aches, chest pain, shortness of breath, mood changes.   Objective  Blood pressure 128/82, pulse 85, height 6\' 2"  (1.88 m), weight 253 lb (114.8 kg), SpO2 96 %.   General: Donald [name] apparent distress alert and oriented x3 mood and affect normal, dressed appropriately.  HEENT: Pupils equal, extraocular movements intact  Respiratory: Patient's speak in full sentences and does not appear short of breath  Cardiovascular: Donald [name] lower extremity edema, non tender, Donald [name] erythema  Skin: Warm dry intact with Donald [name] signs of infection or rash on extremities or on axial skeleton.  Abdomen: Soft nontender  Neuro: Cranial nerves II through XII are intact, neurovascularly intact in all extremities with 2+ DTRs and 2+ pulses.  Lymph: Donald [name] lymphadenopathy of posterior or anterior cervical chain or axillae bilaterally.  Gait normal with good balance and coordination.  MSK:  Non tender with full range of motion and good stability and symmetric strength and tone of shoulders, elbows, wrist, hip, knee bilaterally.   Patient's right ankle does show some swelling around the Achilles.  Tightness of the posterior cord noted.  Patient does have mild arthritic changes that the ankle.  Near full range of motion.  Achilles tendon reflex intact.  Neurovascular intact distally  Neck exam does have some loss of lordosis.  Tender to palpation the paraspinal musculature lumbar spine.  Lacks last 10 degrees of extension.  Significant tightness of the hamstring but Donald [name] true radicular symptoms.  Deep tendon reflexes intact   Limited musculoskeletal ultrasound was performed and interpreted by Donald Donald [name]  Limited ultrasound of patient's Achilles does show some hypoechoic changes at the insertion.  Mild thickening of the Achilles noted but Donald [name] true nodule noted.  retrocalcaneal bursitis noted.   [id number]; 15 additional minutes spent for Therapeutic exercises as stated in above notes.  This included exercises focusing on stretching, strengthening, with significant focus on eccentric aspects.   Long term goals include an improvement in range of motion, strength, endurance as well as avoiding reinjury. Patient's  frequency would include in 1-2 times a day, 3-5 times a week for a duration of 6-12 weeks. Ankle strengthening that included:  Basic range of motion exercises to allow proper full motion at ankle Stretching of the lower leg and hamstrings  Theraband exercises for the lower leg - inversion, eversion, dorsiflexion and plantarflexion each to be completed with a theraband Balance exercises to increase proprioception Weight bearing exercises to increase strength and balance   Proper technique shown and discussed handout in great detail with ATC.  All questions were discussed and answered.     Impression and Recommendations:     This case required medical decision making of moderate complexity. The above documentation has been reviewed and is accurate and complete Donald Pulley, DO       Note: This dictation was prepared with Dragon dictation along with smaller phrase technology. Any transcriptional errors that result from this process are unintentional.

## 2018-10-10 ENCOUNTER — Ambulatory Visit: Payer: Self-pay | Admitting: Family Medicine

## 2018-10-10 ENCOUNTER — Other Ambulatory Visit: Payer: Self-pay

## 2018-10-10 ENCOUNTER — Encounter: Payer: Self-pay | Admitting: Family Medicine

## 2018-10-10 DIAGNOSIS — M7661 Achilles tendinitis, right leg: Secondary | ICD-10-CM

## 2018-10-10 NOTE — Progress Notes (Signed)
[name] [name], [place] [address] Phone: [phone] Subjective:   I [name] am serving as a Education administrator for Dr. Hulan Saas.  I'm seeing this patient by the request  of:    CC: Ankle pain follow-up  HYW:[id number]   09/12/2018 Chronic Achilles tendinitis noted on the right side.  Possibility of gouty deposits noted as well.  We discussed with patient in great length.  Discussed home exercises and icing regimen topical anti-inflammatories, nitroglycerin protocol.  We discussed which activities to do which wants to avoid.  Discussed heel lift.  Follow-up again 4 to 6 weeks  10/10/2018 Donald [name] is a 63 y.o. male coming in with complaint of right heel pain. States he is feeling a lot better. Patient has been doing very well with the nitroglycerin with no significant side effects.  Patient states that the gouty deposits seem to be improved as well.  Patient states that there has been days where he has been walking normal.  Patient states that he is 85% better overall.    Past Medical History:  Diagnosis Date  . Blepharitis   . Cellulitis 2015   Eyelid, Jeralene Huff  . ED (erectile dysfunction) 10/08/2011   Following nerve sparing robotic prostatectomy he has decreased tumescence   . HNP (herniated nucleus pulposus)    lumbar spine  . Hyperlipidemia    mild  . Prostate cancer [facility])    Past Surgical History:  Procedure Laterality Date  . BACK SURGERY     ~ 2005  . PROSTATECTOMY     robotic '08 at [place]  . TONSILLECTOMY     Social History   Socioeconomic History  . Marital status: Married    Spouse name: Not on file  . Number of children: 0  . Years of education: Not on file  . Highest education level: Not on file  Occupational History  . Occupation: cpa   Social Needs  . Financial resource strain: Not on file  . Food insecurity    Worry: Not on file    Inability: Not on file  . Transportation needs    Medical: Not  on file    Non-medical: Not on file  Tobacco Use  . Smoking status: Never Smoker  . Smokeless tobacco: Never Used  Substance and Sexual Activity  . Alcohol use: Yes    Alcohol/week: 6.0 standard drinks    Types: 6 drink(s) per week  . Drug use: No  . Sexual activity: Yes    Partners: Female  Lifestyle  . Physical activity    Days per week: Not on file    Minutes per session: Not on file  . Stress: Not on file  Relationships  . Social Herbalist on phone: Not on file    Gets together: Not on file    Attends religious service: Not on file    Active member of club or organization: Not on file    Attends meetings of clubs or organizations: Not on file    Relationship status: Not on file  Other Topics Concern  . Not on file  Social History Narrative   [employer]. 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    Oldest step dtr died of suicide.    Marriage in good health.   No Known Allergies Family History  Problem Relation Age of  Onset  . Stroke Father 41  . Other Father        tobacco abuse  . Alzheimer's disease Mother   . Coronary artery disease Sister 74       MI and sudden deat age 42  . Obesity Sister   . Other Sister        tobacco abuse  . Diabetes Paternal Grandmother   . Prostate cancer Brother   . Lymphoma Brother        ?? gastric  . Colon cancer Neg Hx      Current Outpatient Medications (Cardiovascular):  Marland Kitchen  Alprostadil, Vasodilator, (MUSE UR), Place into the urethra. Injection prn .  nitroGLYCERIN (NITRO-DUR) 0.2 mg/hr patch, Apply 1/4 of a patch to skin once daily.  Current Outpatient Medications (Respiratory):  .  cetirizine (ZYRTEC) 10 MG tablet, Take 10 mg by mouth daily as needed.    Current Outpatient Medications (Analgesics):  .  aspirin EC 81 MG tablet, Take 1 tablet (81 mg total) by mouth daily.   Current Outpatient Medications (Other):   Marland Kitchen  Diclofenac Sodium 2 % SOLN, Place 2 g onto the skin 2 (two) times daily. Marland Kitchen  gabapentin (NEURONTIN) 100 MG capsule, Take 2 capsules (200 mg total) by mouth at bedtime.    Past medical history, social, surgical and family history all reviewed in electronic medical record.  No pertanent information unless stated regarding to the chief complaint.   Review of Systems:  No headache, visual changes, nausea, vomiting, diarrhea, constipation, dizziness, abdominal pain, skin rash, fevers, chills, night sweats, weight loss, swollen lymph nodes, body aches, joint swelling, muscle aches, chest pain, shortness of breath, mood changes.   Objective  Blood pressure 136/80, pulse 77, height 6\' 2"  (1.88 m), weight 255 lb (115.7 kg), SpO2 96 %.    General: No apparent distress alert and oriented x3 mood and affect normal, dressed appropriately.  HEENT: Pupils equal, extraocular movements intact  Respiratory: Patient's speak in full sentences and does not appear short of breath  Cardiovascular: No lower extremity edema, non tender, no erythema  Skin: Warm dry intact with no signs of infection or rash on extremities or on axial skeleton.  Abdomen: Soft nontender  Neuro: Cranial nerves II through XII are intact, neurovascularly intact in all extremities with 2+ DTRs and 2+ pulses.  Lymph: No lymphadenopathy of posterior or anterior cervical chain or axillae bilaterally.  Gait mild antalgic MSK:  tender with full range of motion and good stability and symmetric strength and tone of shoulders, elbows, wrist, hip, knee bilaterally.  Right ankle exam still has some mild Haglund nodule but no significant erythema or warmness.  Patient seems to be doing much better with range of motion.  Very minimal tender.     Impression and Recommendations:    . The above documentation has been reviewed and is accurate and complete Lyndal Pulley, DO       Note: This dictation was prepared with Dragon dictation along  with smaller phrase technology. Any transcriptional errors that result from this process are unintentional.

## 2018-10-10 NOTE — Assessment & Plan Note (Signed)
Significant improvement at this time.  Continue the nitroglycerin for 1 more month.  Topical anti-inflammatories as needed.  Continue the over-the-counter medications.  Follow-up with me again 8 weeks if not completely resolved

## 2018-10-10 NOTE — Patient Instructions (Signed)
Great to see you  Ice when you need it Gabapentin only if you want Keep the Nitro for another month then 3 times a week for 2 weeks then stop  30 minutes tops and only 3 times a week.  - Run 2 mins, then walk 1 min week 1  -Then run 3 mins, and walk 1 min week 2 -Then run 4 mins, and walk 1 min week 3  -Then run 5 mins, and walk 1 min week 4 -Slowly build up weekly to running 30 mins nonstop.  If painful at any of the steps, back up one step. See me again in 2 months if not perfect

## 2018-10-31 ENCOUNTER — Encounter: Payer: Self-pay | Admitting: Internal Medicine

## 2019-01-25 LAB — HM COLONOSCOPY

## 2019-02-12 ENCOUNTER — Telehealth: Payer: Self-pay

## 2019-02-12 DIAGNOSIS — E785 Hyperlipidemia, unspecified: Secondary | ICD-10-CM

## 2019-02-12 DIAGNOSIS — Z0000 Encounter for general adult medical examination without abnormal findings: Secondary | ICD-10-CM

## 2019-02-12 NOTE — Telephone Encounter (Signed)
Orders placed. Please schedule fasting lab appt several days prior to cpe. Thank you.

## 2019-02-12 NOTE — Telephone Encounter (Signed)
Recommend: CMP, FLP, CBC, TSH, PSA, A1c

## 2019-02-12 NOTE — Telephone Encounter (Signed)
Pt scheduled 03/14/2019 at 3pm for CPE. Requesting labs prior to appt. Please advise.

## 2019-02-13 NOTE — Telephone Encounter (Signed)
Called patient he decided not to do prelabs he stated he will just do them during his appt

## 2019-03-05 ENCOUNTER — Encounter: Payer: Self-pay | Admitting: Internal Medicine

## 2019-03-14 ENCOUNTER — Encounter: Payer: Self-pay | Admitting: Internal Medicine

## 2019-03-14 ENCOUNTER — Ambulatory Visit (INDEPENDENT_AMBULATORY_CARE_PROVIDER_SITE_OTHER): Payer: Self-pay | Admitting: Internal Medicine

## 2019-03-14 ENCOUNTER — Other Ambulatory Visit: Payer: Self-pay

## 2019-03-14 VITALS — BP 135/76 | HR 64 | Temp 96.8°F | Resp 16 | Ht 74.0 in | Wt 253.0 lb

## 2019-03-14 DIAGNOSIS — Z8546 Personal history of malignant neoplasm of prostate: Secondary | ICD-10-CM

## 2019-03-14 DIAGNOSIS — E785 Hyperlipidemia, unspecified: Secondary | ICD-10-CM

## 2019-03-14 DIAGNOSIS — Z0000 Encounter for general adult medical examination without abnormal findings: Secondary | ICD-10-CM

## 2019-03-14 DIAGNOSIS — Z8249 Family history of ischemic heart disease and other diseases of the circulatory system: Secondary | ICD-10-CM

## 2019-03-14 NOTE — Patient Instructions (Signed)
GO TO THE LAB : Get the blood work     GO TO THE FRONT DESK Schedule your next appointment   for a physical exam in 1 year 

## 2019-03-14 NOTE — Progress Notes (Signed)
Subjective:    Patient ID: Donald [name], male    DOB: April 03, 1956, 63 y.o.   MRN: [id number]  DOS:  03/14/2019 Type of visit - description: CPX In general feeling well, has no major concerns. He gained some weight earlier this year but now is exercising more and is losing weight  Wt Readings from Last 3 Encounters:  03/14/19 253 lb (114.8 kg)  10/10/18 255 lb (115.7 kg)  09/12/18 253 lb (114.8 kg)     Review of Systems Reports a feeling of water in the left ear for a while.  No pain, discharge or HOH.  Admits to nasal congestion  Other than above, a 14 point review of systems is negative    Past Medical History:  Diagnosis Date  . Blepharitis   . Cellulitis 2015   Eyelid, Jeralene Huff  . ED (erectile dysfunction) 10/08/2011   Following nerve sparing robotic prostatectomy he has decreased tumescence   . HNP (herniated nucleus pulposus)    lumbar spine  . Hyperlipidemia    mild  . Prostate cancer [facility])     Past Surgical History:  Procedure Laterality Date  . BACK SURGERY     ~ 2005  . PROSTATECTOMY     robotic '08 at [employer]  . TONSILLECTOMY      Social History   Socioeconomic History  . Marital status: Married    Spouse name: Not on file  . Number of children: 0  . Years of education: Not on file  . Highest education level: Not on file  Occupational History  . Occupation: cpa   Tobacco Use  . Smoking status: Never Smoker  . Smokeless tobacco: Never Used  Substance and Sexual Activity  . Alcohol use: Yes    Alcohol/week: 6.0 standard drinks    Types: 6 drink(s) per week  . Drug use: No  . Sexual activity: Yes    Partners: Female  Other Topics Concern  . Not on file  Social History Narrative   [employer]. 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    Oldest step dtr died of suicide.    Marriage in good health.   Social Determinants of Health   Financial  Resource Strain:   . Difficulty of Paying Living Expenses: Not on file  Food Insecurity:   . Worried About Charity fundraiser in the Last Year: Not on file  . Ran Out of Food in the Last Year: Not on file  Transportation Needs:   . Lack of Transportation (Medical): Not on file  . Lack of Transportation (Non-Medical): Not on file  Physical Activity:   . Days of Exercise per Week: Not on file  . Minutes of Exercise per Session: Not on file  Stress:   . Feeling of Stress : Not on file  Social Connections:   . Frequency of Communication with Friends and Family: Not on file  . Frequency of Social Gatherings with Friends and Family: Not on file  . Attends Religious Services: Not on file  . Active Member of Clubs or Organizations: Not on file  . Attends Archivist Meetings: Not on file  . Marital Status: Not on file  Intimate Partner Violence:   . Fear of Current or Ex-Partner: Not on file  . Emotionally Abused: Not on file  . Physically Abused: Not on file  . Sexually Abused: Not on file  Family History  Problem Relation Age of Onset  . Stroke Father 61  . Other Father        tobacco abuse  . Alzheimer's disease Mother   . Coronary artery disease Sister 56       MI and sudden deat age 68  . Obesity Sister   . Other Sister        tobacco abuse  . Diabetes Paternal Grandmother   . Prostate cancer Brother   . Lymphoma Brother        ?? gastric  . Colon cancer Neg Hx      Allergies as of 03/14/2019   No Known Allergies     Medication List       Accurate as of March 14, 2019 11:59 PM. If you have any questions, ask your nurse or doctor.        STOP taking these medications   Diclofenac Sodium 2 % Soln Stopped by: Kathlene November, MD   gabapentin 100 MG capsule Commonly known as: NEURONTIN Stopped by: Kathlene November, MD   MUSE UR Stopped by: Kathlene November, MD   nitroGLYCERIN 0.2 mg/hr patch Commonly known as: Nitro-Dur Stopped by: Kathlene November, MD     TAKE these  medications   aspirin EC 81 MG tablet Take 1 tablet (81 mg total) by mouth daily.   cetirizine 10 MG tablet Commonly known as: ZYRTEC Take 10 mg by mouth daily as needed.           Objective:   Physical Exam BP 135/76 (BP Location: Left Arm, Patient Position: Sitting, Cuff Size: Normal)   Pulse 64   Temp (!) 96.8 F (36 C) (Temporal)   Resp 16   Ht 6\' 2"  (1.88 m)   Wt 253 lb (114.8 kg)   SpO2 99%   BMI 32.48 kg/m  General: Well developed, NAD, BMI noted Neck: No  thyromegaly  HEENT:  Normocephalic . Face symmetric, atraumatic Nose slightly congested. Right ear normal Left ear: TM with minimal bulging, not red.  At the anterior superior quadrant of the TM there is a small place, looks like a small polyp, white in color. Lungs:  CTA B Normal respiratory effort, no intercostal retractions, no accessory muscle use. Heart: RRR,  no murmur.  No pretibial edema bilaterally  Abdomen:  Not distended, soft, non-tender. No rebound or rigidity.   Skin: Exposed areas without rash. Not pale. Not jaundice Neurologic:  alert & oriented X3.  Speech normal, gait appropriate for age and unassisted Strength symmetric and appropriate for age.  Psych: Cognition and judgment appear intact.  Cooperative with normal attention span and concentration.  Behavior appropriate. No anxious or depressed appearing.     Assessment     Assessment  Hyperlipidemia H/o psoriasis , sees derm prn; occasional flareup, groins GU: -H/o Prostate cancer , robotic prostatectomy 2008, [facility]. No incontinence. F/u per PCP w/ yearly PSAs -ED: s/p urology ([facility]) 07-2014, dc cialis, rx local injection, works well, has testost checked - h/o stress incontinence  H/o HPN, surgery 2005 +FH CAD sister (age 93)  PLAN: Hyperlipidemia: On no medications, doing okay with diet and exercise, checking labs History of prostate cancer: Asymptomatic, he did see urology 2019 mostly for ED, does not plan to go back  to them.  Check a PSA. ED: No treatment has been effective so far. Paresthesias: See last visit in 2019, I referred him to neurology but he saw another doctor, after work-up was done he was diagnosed with paresthesias  due to back problems.  Currently on no treatment and symptom are not bothersome. FH CAD: Recommend to go back on aspirin. EKG today: NSR His cardiovascular risk factor is 16.1%, he also have FH CAD.  We will do labs today and consider meds if CV RF > 10%. Ear congestion: Probably due to allergies, recommend Flonase. Left TM is not completely normal, see physical exam,  rx ENT for a thorough evaluation.  Patient declined RTC 1 year    This visit occurred during the SARS-CoV-2 public health emergency.  Safety protocols were in place, including screening questions prior to the visit, additional usage of staff PPE, and extensive cleaning of exam room while observing appropriate contact time as indicated for disinfecting solutions.

## 2019-03-14 NOTE — Progress Notes (Signed)
Pre visit review using our clinic review tool, if applicable. No additional management support is needed unless otherwise documented below in the visit note. 

## 2019-03-14 NOTE — Assessment & Plan Note (Addendum)
-  Td 2013 - had a Flu shot  -CCS: 1st cscope (-), 2nd Cscope  03/24/2015, Dr. Ferdinand Lango. Tubular adenoma. Rx to re-do cscope in 3 years due to personal history of colon polyps and inadequate prep.  Cscope in [place] 01/2019 per pt, had a polyp, next in 7 years  - h/o prostate cancer, on yearly PSAs.  Check a PSA. -Diet, exercise: just going back to the gym, diet ok   -Labs:    CMP, FLP, CBC, A1c, PSA

## 2019-03-15 LAB — CBC WITH DIFFERENTIAL/PLATELET
Basophils Absolute: 0.1 10*3/uL (ref 0.0–0.1)
Basophils Relative: 1.1 % (ref 0.0–3.0)
Eosinophils Absolute: 0.2 10*3/uL (ref 0.0–0.7)
Eosinophils Relative: 2.8 % (ref 0.0–5.0)
HCT: 45.8 % (ref 39.0–52.0)
Hemoglobin: 15.5 g/dL (ref 13.0–17.0)
Lymphocytes Relative: 28.2 % (ref 12.0–46.0)
Lymphs Abs: 2.3 10*3/uL (ref 0.7–4.0)
MCHC: 33.8 g/dL (ref 30.0–36.0)
MCV: 99.4 fl (ref 78.0–100.0)
Monocytes Absolute: 0.8 10*3/uL (ref 0.1–1.0)
Monocytes Relative: 9.9 % (ref 3.0–12.0)
Neutro Abs: 4.7 10*3/uL (ref 1.4–7.7)
Neutrophils Relative %: 58 % (ref 43.0–77.0)
Platelets: 343 10*3/uL (ref 150.0–400.0)
RBC: 4.61 Mil/uL (ref 4.22–5.81)
RDW: 12.4 % (ref 11.5–15.5)
WBC: 8.1 10*3/uL (ref 4.0–10.5)

## 2019-03-15 LAB — COMPREHENSIVE METABOLIC PANEL
ALT: 35 U/L (ref 0–53)
AST: 24 U/L (ref 0–37)
Albumin: 4.5 g/dL (ref 3.5–5.2)
Alkaline Phosphatase: 64 U/L (ref 39–117)
BUN: 12 mg/dL (ref 6–23)
CO2: 26 mEq/L (ref 19–32)
Calcium: 9.3 mg/dL (ref 8.4–10.5)
Chloride: 103 mEq/L (ref 96–112)
Creatinine, Ser: 0.77 mg/dL (ref 0.40–1.50)
GFR: 101.84 mL/min (ref >60.00)
Glucose, Bld: 87 mg/dL (ref 70–99)
Potassium: 4.2 mEq/L (ref 3.5–5.1)
Sodium: 139 mEq/L (ref 135–145)
Total Bilirubin: 0.9 mg/dL (ref 0.2–1.2)
Total Protein: 7.2 g/dL (ref 6.0–8.3)

## 2019-03-15 LAB — HEMOGLOBIN A1C: Hgb A1c MFr Bld: 5.8 % (ref 4.6–6.5)

## 2019-03-15 LAB — LIPID PANEL
Cholesterol: 254 mg/dL — ABNORMAL HIGH (ref 0–200)
HDL: 40.6 mg/dL (ref >39.00)
NonHDL: 213.43
Total CHOL/HDL Ratio: 6
Triglycerides: 232 mg/dL — ABNORMAL HIGH (ref 0.0–149.0)
VLDL: 46.4 mg/dL — ABNORMAL HIGH (ref 0.0–40.0)

## 2019-03-15 LAB — PSA: PSA: 0 ng/mL — ABNORMAL LOW (ref 0.10–4.00)

## 2019-03-15 LAB — LDL CHOLESTEROL, DIRECT: Direct LDL: 156 mg/dL

## 2019-03-15 NOTE — Assessment & Plan Note (Signed)
Hyperlipidemia: On no medications, doing okay with diet and exercise, checking labs History of prostate cancer: Asymptomatic, he did see urology 2019 mostly for ED, does not plan to go back to them.  Check a PSA. ED: No treatment has been effective so far. Paresthesias: See last visit in 2019, I referred him to neurology but he saw another doctor, after work-up was done he was diagnosed with paresthesias due to back problems.  Currently on no treatment and symptom are not bothersome. FH CAD: Recommend to go back on aspirin. EKG today: NSR His cardiovascular risk factor is 16.1%, he also have FH CAD.  We will do labs today and consider meds if CV RF > 10%. Ear congestion: Probably due to allergies, recommend Flonase. Left TM is not completely normal, see physical exam,  rx ENT for a thorough evaluation.  Patient declined RTC 1 year

## 2019-03-19 MED ORDER — ATORVASTATIN CALCIUM 20 MG PO TABS: 20.0000 mg | ORAL_TABLET | Freq: Every day | ORAL | 3 refills | Status: DC

## 2019-03-19 NOTE — Addendum Note (Signed)
Addended byDamita Dunnings D on: 03/19/2019 01:16 PM   Modules accepted: Orders

## 2019-03-21 ENCOUNTER — Telehealth: Payer: Self-pay | Admitting: Internal Medicine

## 2019-03-21 NOTE — Telephone Encounter (Signed)
Called patient lvm to call back for 2 month lab appt

## 2019-03-21 NOTE — Telephone Encounter (Signed)
-----   Message from [place], [place] sent at 03/19/2019  1:48 PM EST ----- Regarding: Labs Needs fasting lab appt in 2 months please.

## 2019-05-21 ENCOUNTER — Other Ambulatory Visit (INDEPENDENT_AMBULATORY_CARE_PROVIDER_SITE_OTHER): Payer: No Typology Code available for payment source

## 2019-05-21 ENCOUNTER — Other Ambulatory Visit: Payer: Self-pay

## 2019-05-21 DIAGNOSIS — E785 Hyperlipidemia, unspecified: Secondary | ICD-10-CM | POA: Diagnosis not present

## 2019-05-21 LAB — LIPID PANEL
Cholesterol: 171 mg/dL (ref 0–200)
HDL: 41.8 mg/dL (ref >39.00)
LDL Cholesterol: 100 mg/dL — ABNORMAL HIGH (ref 0–99)
NonHDL: 128.92
Total CHOL/HDL Ratio: 4
Triglycerides: 146 mg/dL (ref 0.0–149.0)
VLDL: 29.2 mg/dL (ref 0.0–40.0)

## 2019-05-21 LAB — ALT: ALT: 47 U/L (ref 0–53)

## 2019-05-21 LAB — AST: AST: 28 U/L (ref 0–37)

## 2019-05-24 MED ORDER — ATORVASTATIN CALCIUM 20 MG PO TABS: 20.0000 mg | ORAL_TABLET | Freq: Every day | ORAL | 3 refills | Status: DC

## 2019-05-24 NOTE — Addendum Note (Signed)
Addended byDamita Dunnings D on: 05/24/2019 11:50 AM   Modules accepted: Orders

## 2020-06-12 ENCOUNTER — Encounter: Payer: Self-pay | Admitting: Internal Medicine

## 2020-09-09 ENCOUNTER — Ambulatory Visit: Payer: No Typology Code available for payment source | Admitting: Internal Medicine

## 2020-09-22 ENCOUNTER — Encounter: Payer: Self-pay | Admitting: Internal Medicine

## 2020-10-01 ENCOUNTER — Other Ambulatory Visit: Payer: Self-pay

## 2020-10-01 ENCOUNTER — Encounter: Payer: Self-pay | Admitting: Internal Medicine

## 2020-10-01 ENCOUNTER — Ambulatory Visit (INDEPENDENT_AMBULATORY_CARE_PROVIDER_SITE_OTHER): Payer: Medicare Other | Admitting: Internal Medicine

## 2020-10-01 VITALS — BP 138/92 | HR 82 | Temp 97.9°F | Resp 16 | Ht 74.0 in | Wt 267.1 lb

## 2020-10-01 DIAGNOSIS — Z0001 Encounter for general adult medical examination with abnormal findings: Secondary | ICD-10-CM

## 2020-10-01 DIAGNOSIS — Z125 Encounter for screening for malignant neoplasm of prostate: Secondary | ICD-10-CM

## 2020-10-01 DIAGNOSIS — E785 Hyperlipidemia, unspecified: Secondary | ICD-10-CM

## 2020-10-01 DIAGNOSIS — Z0000 Encounter for general adult medical examination without abnormal findings: Secondary | ICD-10-CM

## 2020-10-01 DIAGNOSIS — Z0189 Encounter for other specified special examinations: Secondary | ICD-10-CM | POA: Diagnosis not present

## 2020-10-01 DIAGNOSIS — Z23 Encounter for immunization: Secondary | ICD-10-CM | POA: Diagnosis not present

## 2020-10-01 DIAGNOSIS — G4733 Obstructive sleep apnea (adult) (pediatric): Secondary | ICD-10-CM | POA: Diagnosis not present

## 2020-10-01 DIAGNOSIS — R739 Hyperglycemia, unspecified: Secondary | ICD-10-CM | POA: Diagnosis not present

## 2020-10-01 DIAGNOSIS — R002 Palpitations: Secondary | ICD-10-CM

## 2020-10-01 LAB — COMPREHENSIVE METABOLIC PANEL
ALT: 50 U/L (ref 0–53)
AST: 31 U/L (ref 0–37)
Albumin: 4.3 g/dL (ref 3.5–5.2)
Alkaline Phosphatase: 66 U/L (ref 39–117)
BUN: 10 mg/dL (ref 6–23)
CO2: 26 mEq/L (ref 19–32)
Calcium: 8.7 mg/dL (ref 8.4–10.5)
Chloride: 104 mEq/L (ref 96–112)
Creatinine, Ser: 0.68 mg/dL (ref 0.40–1.50)
GFR: 97.78 mL/min (ref >60.00)
Glucose, Bld: 91 mg/dL (ref 70–99)
Potassium: 4.5 mEq/L (ref 3.5–5.1)
Sodium: 139 mEq/L (ref 135–145)
Total Bilirubin: 0.7 mg/dL (ref 0.2–1.2)
Total Protein: 6.9 g/dL (ref 6.0–8.3)

## 2020-10-01 LAB — CBC WITH DIFFERENTIAL/PLATELET
Basophils Absolute: 0 10*3/uL (ref 0.0–0.1)
Basophils Relative: 0.3 % (ref 0.0–3.0)
Eosinophils Absolute: 0.2 10*3/uL (ref 0.0–0.7)
Eosinophils Relative: 2.4 % (ref 0.0–5.0)
HCT: 41.8 % (ref 39.0–52.0)
Hemoglobin: 14.7 g/dL (ref 13.0–17.0)
Lymphocytes Relative: 26.8 % (ref 12.0–46.0)
Lymphs Abs: 1.8 10*3/uL (ref 0.7–4.0)
MCHC: 35.1 g/dL (ref 30.0–36.0)
MCV: 97.5 fl (ref 78.0–100.0)
Monocytes Absolute: 0.7 10*3/uL (ref 0.1–1.0)
Monocytes Relative: 9.7 % (ref 3.0–12.0)
Neutro Abs: 4.1 10*3/uL (ref 1.4–7.7)
Neutrophils Relative %: 60.8 % (ref 43.0–77.0)
Platelets: 342 10*3/uL (ref 150.0–400.0)
RBC: 4.29 Mil/uL (ref 4.22–5.81)
RDW: 12.4 % (ref 11.5–15.5)
WBC: 6.8 10*3/uL (ref 4.0–10.5)

## 2020-10-01 LAB — LIPID PANEL
Cholesterol: 148 mg/dL (ref 0–200)
HDL: 33.3 mg/dL — ABNORMAL LOW (ref >39.00)
NonHDL: 114.38
Total CHOL/HDL Ratio: 4
Triglycerides: 290 mg/dL — ABNORMAL HIGH (ref 0.0–149.0)
VLDL: 58 mg/dL — ABNORMAL HIGH (ref 0.0–40.0)

## 2020-10-01 LAB — PSA: PSA: 0 ng/mL — ABNORMAL LOW (ref 0.10–4.00)

## 2020-10-01 LAB — LDL CHOLESTEROL, DIRECT: Direct LDL: 54 mg/dL

## 2020-10-01 LAB — TSH: TSH: 1.05 u[IU]/mL (ref 0.35–4.50)

## 2020-10-01 LAB — HEMOGLOBIN A1C: Hgb A1c MFr Bld: 6.3 % (ref 4.6–6.5)

## 2020-10-01 NOTE — Patient Instructions (Addendum)
Check the  blood pressure twice a month BP GOAL is between 110/65 and  135/85. If it is consistently higher or lower, let me know  Weight loss: Consider calorie counting (MYFITNESSPAL) Consider the Mediterranean diet Recommend 3 hours of physical activity every week.  Proceed with Shingrix vaccination at your convenience  GO TO THE LAB : Get the blood work     [place], [place] back for a checkup in 6 months

## 2020-10-01 NOTE — Progress Notes (Signed)
Subjective:    Patient ID: Donald [name], male    DOB: 03/22/1956, 65 y.o.   MRN: [id number]  DOS:  10/01/2020 Type of visit - description: CPX, last visit 2020.  Here for CPX. Few months ago experienced palpitations, saw cardiology, notes reviewed.  He is now asymptomatic. BP noted to be elevated, at home is normal. Concerned about his weight  Wt Readings from Last 3 Encounters:  10/01/20 267 lb 2 oz (121.2 kg)  03/14/19 253 lb (114.8 kg)  10/10/18 255 lb (115.7 kg)     Review of Systems Denies dysuria or gross hematuria  Other than above, a 14 point review of systems is negative      Past Medical History:  Diagnosis Date   Blepharitis    Cellulitis 2015   Eyelid, Salomon Mast   ED (erectile dysfunction) 10/08/2011   Following nerve sparing robotic prostatectomy he has decreased tumescence    HNP (herniated nucleus pulposus)    lumbar spine   Hyperlipidemia    mild   Prostate cancer [facility])     Past Surgical History:  Procedure Laterality Date   BACK SURGERY     ~ 2005   PROSTATECTOMY     robotic '08 at [facility]   TONSILLECTOMY     Social History   Socioeconomic History   Marital status: Married    Spouse name: Not on file   Number of children: 0   Years of education: Not on file   Highest education level: Not on file  Occupational History   Occupation: cpa   Tobacco Use   Smoking status: Never   Smokeless tobacco: Never  Substance and Sexual Activity   Alcohol use: Yes    Alcohol/week: 6.0 standard drinks    Types: 6 drink(s) per week    Comment: 2-3 drinks most days   Drug use: No   Sexual activity: Yes    Partners: Female  Other Topics Concern   Not on file  Social History Narrative   [employer]. [employer], IT trainer.    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    Oldest step dtr died of suicide.    Marriage in good health.   Social Determinants of Health   Financial  Resource Strain: Not on file  Food Insecurity: Not on file  Transportation Needs: Not on file  Physical Activity: Not on file  Stress: Not on file  Social Connections: Not on file  Intimate Partner Violence: Not on file    Allergies as of 10/01/2020   No Known Allergies      Medication List        Accurate as of October 01, 2020 11:59 PM. If you have any questions, ask your nurse or doctor.          aspirin EC 81 MG tablet Take 1 tablet (81 mg total) by mouth daily.   atorvastatin 20 MG tablet Commonly known as: LIPITOR Take 1 tablet (20 mg total) by mouth at bedtime.   cetirizine 10 MG tablet Commonly known as: ZYRTEC Take 10 mg by mouth daily as needed.           Objective:   Physical Exam BP (!) 138/92 (BP Location: Left Arm, Patient Position: Sitting, Cuff Size: Normal)   Pulse 82   Temp 97.9 F (36.6 C) (Oral)   Resp 16   Ht 6\' 2"  (1.88 m)   Wt 267 lb 2  oz (121.2 kg)   SpO2 98%   BMI 34.30 kg/m  General: Well developed, NAD, BMI noted Neck: No  thyromegaly  HEENT:  Normocephalic . Face symmetric, atraumatic Lungs:  CTA B Normal respiratory effort, no intercostal retractions, no accessory muscle use. Heart: RRR,  no murmur.  Abdomen:  Not distended, soft, non-tender. No rebound or rigidity.   Lower extremities: no pretibial edema bilaterally  Skin: Exposed areas without rash. Not pale. Not jaundice Neurologic:  alert & oriented X3.  Speech normal, gait appropriate for age and unassisted Strength symmetric and appropriate for age.  Psych: Cognition and judgment appear intact.  Cooperative with normal attention span and concentration.  Behavior appropriate. No anxious or depressed appearing.     Assessment       Assessment  Hyperlipidemia H/o psoriasis , sees derm prn; occasional flareup, groins GU: -H/o Prostate cancer , robotic prostatectomy 2008, [facility]. F/u per PCP w/ yearly PSAs -ED: s/p urology ([facility]) 07-2014, dc cialis, rx  local injection, has testost checked - h/o stress incontinence  H/o HPN, surgery 2005 +FH CAD sister (age 49) Melanoma, Dx 2021, sees Derm regularly OSA?:  Self diagnosed, self treated with a CPAP  PLAN: Here for CPX, last visit 03/2019: Elevated BP: Recommend to continue checking at home Hyperlipidemia: On atorvastatin, last time he was prescribed was 2021.  Compliance?  Checking labs. Melanoma: Recently diagnosed, 2021, sees dermatology regularly Palpitations  Has seen cardiology for palpitations, tachycardia, diagnosed with frequent PVCs presumed from OSA. They Rx Zio patch and echo 04/29/2020. Results wnl per pt. no further symptoms. OSA?  He reports that he "knew" he has sleep apnea, many years ago he got a CPAP online and recently started to using it again.  That is unusual.  Rec formal eval, refer to neurology. RTC 6 months    In addition to CPX, we talk about his chronic medical problems, also 2 new problems palpitations and sleep apnea.  This visit occurred during the SARS-CoV-2 public health emergency.  Safety protocols were in place, including screening questions prior to the visit, additional usage of staff PPE, and extensive cleaning of exam room while observing appropriate contact time as indicated for disinfecting solutions.

## 2020-10-02 ENCOUNTER — Encounter: Payer: Self-pay | Admitting: Internal Medicine

## 2020-10-02 NOTE — Assessment & Plan Note (Signed)
-  Td 2013 - PNM 20: today -Shingrix: Discussed, recommend to get at the pharmacy - Covid vax x 1.  Benefits of booster discussed, she is reluctant to proceed. -CCS: 1st cscope (-), 2nd Cscope  03/24/2015, Dr. Ferdinand Lango. Tubular adenoma. Rx to re-do cscope in 3 years due to personal history of colon polyps and inadequate prep.  Cscope in [place] 01/2019 per pt, had a polyp, next in 7 years ; no documentation, rec to get records  - h/o prostate cancer, on yearly PSAs.  Check a PSA. -Diet, exercise: + Weight gain noted, recommend calorie counting and routine exercise -Labs: CMP, FLP, CBC, A1c, TSH, PSA

## 2020-10-02 NOTE — Assessment & Plan Note (Signed)
Here for CPX, last visit 03/2019: Elevated BP: Recommend to continue checking at home Hyperlipidemia: On atorvastatin, last time he was prescribed was 2021.  Compliance?  Checking labs. Melanoma: Recently diagnosed, 2021, sees dermatology regularly Palpitations  Has seen cardiology for palpitations, tachycardia, diagnosed with frequent PVCs presumed from OSA. They Rx Zio patch and echo 04/29/2020. Results wnl per pt. no further symptoms. OSA?  He reports that he "knew" he has sleep apnea, many years ago he got a CPAP online and recently started to using it again.  That is unusual.  Rec formal eval, refer to neurology. RTC 6 months

## 2020-10-03 ENCOUNTER — Other Ambulatory Visit: Payer: Self-pay | Admitting: Internal Medicine

## 2020-10-03 MED ORDER — ATORVASTATIN CALCIUM 20 MG PO TABS: 20.0000 mg | ORAL_TABLET | Freq: Every day | ORAL | 3 refills | Status: DC

## 2020-10-03 NOTE — Addendum Note (Signed)
Addended byDamita Dunnings D on: 10/03/2020 07:45 AM   Modules accepted: Orders

## 2020-10-29 ENCOUNTER — Encounter: Payer: Self-pay | Admitting: Neurology

## 2020-11-18 ENCOUNTER — Other Ambulatory Visit: Payer: Self-pay

## 2020-11-18 DIAGNOSIS — R202 Paresthesia of skin: Secondary | ICD-10-CM

## 2020-11-19 ENCOUNTER — Other Ambulatory Visit: Payer: Self-pay

## 2020-11-19 ENCOUNTER — Ambulatory Visit: Payer: Medicare Other | Admitting: Neurology

## 2020-11-19 DIAGNOSIS — R202 Paresthesia of skin: Secondary | ICD-10-CM

## 2020-11-19 DIAGNOSIS — M5417 Radiculopathy, lumbosacral region: Secondary | ICD-10-CM

## 2020-11-19 NOTE — Procedures (Signed)
[facility] Neu[facility]  [address], [place]  [place], [place] [address] Tel: [phone] Fax:  [phone] Test Date:  11/19/2020  Patient: Donald [name] DOB: 1955/11/16 Physician: Narda Amber, DO  Sex: Male Height: '6\' 2"'$  Ref Phys: Ernestina Patches  ID#: [id number]   Technician:    Patient Complaints: This is a 65 year old man referred for evaluation of bilateral leg numbness.  NCV & EMG Findings: Extensive electrodiagnostic testing of the right lower extremity and additional studies of the left shows:  Bilateral sural and superficial peroneal sensory responses are within normal limits. Bilateral peroneal and tibial motor responses are within normal limits. Bilateral tibial H reflex studies are within normal limits. Chronic motor axonal loss changes are seen affecting the L5 myotome bilaterally, without accompanying active denervation.  Impression: Chronic L5 radiculopathy affecting bilateral lower extremities, mild. There is no evidence of a large fiber sensorimotor polyneuropathy affecting the lower extremities.   ___________________________ Narda Amber, DO    Nerve Conduction Studies Anti Sensory Summary Table   Stim Site NR Peak (ms) Norm Peak (ms) P-T Amp (V) Norm P-T Amp  Left Sup Peroneal Anti Sensory (Ant Lat Mall)  33C  12 cm    2.8 <4.6 8.5 >3  Right Sup Peroneal Anti Sensory (Ant Lat Mall)  33C  12 cm    3.0 <4.6 6.4 >3  Left Sural Anti Sensory (Lat Mall)  33C  Calf    3.0 <4.6 7.6 >3  Right Sural Anti Sensory (Lat Mall)  33C  Calf    2.7 <4.6 8.4 >3   Motor Summary Table   Stim Site NR Onset (ms) Norm Onset (ms) O-P Amp (mV) Norm O-P Amp Site1 Site2 Delta-0 (ms) Dist (cm) Vel (m/s) Norm Vel (m/s)  Left Peroneal Motor (Ext Dig Brev)  33C  Ankle    3.0 <6.0 2.6 >2.5 B Fib Ankle 9.1 37.0 41 >40  B Fib    12.1  1.8  Poplt B Fib 2.0 8.0 40 >40  Poplt    14.1  1.8         Right Peroneal Motor (Ext Dig Brev)  33C  Ankle    3.0 <6.0 3.6  >2.5 B Fib Ankle 9.0 37.0 41 >40  B Fib    12.0  3.0  Poplt B Fib 2.5 10.0 40 >40  Poplt    14.5  2.7         Left Tibial Motor (Abd Hall Brev)  33C  Ankle    3.0 <6.0 4.1 >4 Knee Ankle 11.7 45.0 40 >40  Knee    14.7  2.3         Right Tibial Motor (Abd Hall Brev)  33C  Ankle    4.1 <6.0 4.9 >4 Knee Ankle 10.4 46.0 44 >40  Knee    14.5  2.5          H Reflex Studies   NR H-Lat (ms) Lat Norm (ms) L-R H-Lat (ms)  Left Tibial (Gastroc)  33C     33.88 <35 1.36  Right Tibial (Gastroc)  33C     32.52 <35 1.36   EMG   Side Muscle Ins Act Fibs Psw Fasc Number Recrt Dur Dur. Amp Amp. Poly Poly. Comment  Right AntTibialis Nml Nml Nml Nml 1- Rapid Few 1+ Few 1+ Few 1+ N/A  Right Gastroc Nml Nml Nml Nml Nml Nml Nml Nml Nml Nml Nml Nml N/A  Right Flex Dig Long Nml Nml Nml Nml 1- Rapid Few  1+ Few 1+ Few 1+ N/A  Right RectFemoris Nml Nml Nml Nml Nml Nml Nml Nml Nml Nml Nml Nml N/A  Right GluteusMed Nml Nml Nml Nml 1- Rapid Few 1+ Few 1+ Few 1+ N/A  Left AntTibialis Nml Nml Nml Nml 1- Rapid Few 1+ Few 1+ Few 1+ N/A  Left Gastroc Nml Nml Nml Nml Nml Nml Nml Nml Nml Nml Nml Nml N/A  Left Flex Dig Long Nml Nml Nml Nml 1- Rapid Few 1+ Few 1+ Few 1+ N/A  Left RectFemoris Nml Nml Nml Nml Nml Nml Nml Nml Nml Nml Nml Nml N/A  Left GluteusMed Nml Nml Nml Nml 1- Rapid Few 1+ Few 1+ Few 1+ N/A      Waveforms:

## 2020-12-03 ENCOUNTER — Encounter: Payer: Medicare Other | Admitting: Neurology

## 2020-12-10 ENCOUNTER — Ambulatory Visit: Payer: Medicare Other | Admitting: Neurology

## 2020-12-10 ENCOUNTER — Encounter: Payer: Self-pay | Admitting: Neurology

## 2020-12-10 VITALS — BP 127/80 | HR 80 | Ht 74.0 in | Wt 263.5 lb

## 2020-12-10 DIAGNOSIS — R0683 Snoring: Secondary | ICD-10-CM

## 2020-12-10 DIAGNOSIS — R0981 Nasal congestion: Secondary | ICD-10-CM

## 2020-12-10 DIAGNOSIS — G4730 Sleep apnea, unspecified: Secondary | ICD-10-CM

## 2020-12-10 DIAGNOSIS — I493 Ventricular premature depolarization: Secondary | ICD-10-CM | POA: Diagnosis not present

## 2020-12-10 DIAGNOSIS — G4710 Hypersomnia, unspecified: Secondary | ICD-10-CM

## 2020-12-10 DIAGNOSIS — F40240 Claustrophobia: Secondary | ICD-10-CM | POA: Insufficient documentation

## 2020-12-10 NOTE — Patient Instructions (Signed)
Sleep Apnea Sleep apnea affects breathing during sleep. It causes breathing to stop for 10 seconds or more, or to become shallow. People with sleep apnea usually snore loudly. It can also increase the risk of: Heart attack. Stroke. Being very overweight (obese). Diabetes. Heart failure. Irregular heartbeat. High blood pressure. The goal of treatment is to help you breathe normally again. What are the causes? The most common cause of this condition is a collapsed or blocked airway. There are three kinds of sleep apnea: Obstructive sleep apnea. This is caused by a blocked or collapsed airway. Central sleep apnea. This happens when the brain does not send the right signals to the muscles that control breathing. Mixed sleep apnea. This is a combination of obstructive and central sleep apnea. What increases the risk? Being overweight. Smoking. Having a small airway. Being older. Being male. Drinking alcohol. Taking medicines to calm yourself (sedatives or tranquilizers). Having family members with the condition. Having a tongue or tonsils that are larger than normal. What are the signs or symptoms? Trouble staying asleep. Loud snoring. Headaches in the morning. Waking up gasping. Dry mouth or sore throat in the morning. Being sleepy or tired during the day. If you are sleepy or tired during the day, you may also: Not be able to focus your mind (concentrate). Forget things. Get angry a lot and have mood swings. Feel sad (depressed). Have changes in your personality. Have less interest in sex, if you are male. Be unable to have an erection, if you are male. How is this treated?  Sleeping on your side. Using a medicine to get rid of mucus in your nose (decongestant). Avoiding the use of alcohol, medicines to help you relax, or certain pain medicines (narcotics). Losing weight, if needed. Changing your diet. Quitting smoking. Using a machine to open your airway while you  sleep, such as: An oral appliance. This is a mouthpiece that shifts your lower jaw forward. A CPAP device. This device blows air through a mask when you breathe out (exhale). An EPAP device. This has valves that you put in each nostril. A BPAP device. This device blows air through a mask when you breathe in (inhale) and breathe out. Having surgery if other treatments do not work. Follow these instructions at home: Lifestyle Make changes that your doctor recommends. Eat a healthy diet. Lose weight if needed. Avoid alcohol, medicines to help you relax, and some pain medicines. Do not smoke or use any products that contain nicotine or tobacco. If you need help quitting, ask your doctor. General instructions Take over-the-counter and prescription medicines only as told by your doctor. If you were given a machine to use while you sleep, use it only as told by your doctor. If you are having surgery, make sure to tell your doctor you have sleep apnea. You may need to bring your device with you. Keep all follow-up visits. Contact a doctor if: The machine that you were given to use during sleep bothers you or does not seem to be working. You do not get better. You get worse. Get help right away if: Your chest hurts. You have trouble breathing in enough air. You have an uncomfortable feeling in your back, arms, or stomach. You have trouble talking. One side of your body feels weak. A part of your face is hanging down. These symptoms may be an emergency. Get help right away. Call your local emergency services (911 in the [place] Do not wait to see if the symptoms  will go away. Do not drive yourself to the hospital. Summary This condition affects breathing during sleep. The most common cause is a collapsed or blocked airway. The goal of treatment is to help you breathe normally while you sleep. This information is not intended to replace advice given to you by your health care provider. Make  sure you discuss any questions you have with your health care provider. Document Revised: 02/29/2020 Document Reviewed: 02/29/2020 Elsevier Patient Education  2022 [place]. Hypersomnia Hypersomnia is a condition in which a person feels very tired during the day even though he or she gets plenty of sleep at night. A person with this condition may take naps during the day and may find it very difficult to wake up from sleep. Hypersomnia may affect a person's ability to think, concentrate, drive, or remember things. What are the causes? The cause of this condition may not be known. Possible causes include: Certain medicines. Sleep disorders, such as narcolepsy and sleep apnea. Injury to the head, brain, or spinal cord. Drug or alcohol use. Gastroesophageal reflux disease (GERD). Tumors. Certain medical conditions, such as depression, diabetes, or an underactive thyroid gland (hypothyroidism). What are the signs or symptoms? The main symptoms of hypersomnia include: Feeling very tired throughout the day, regardless of how much sleep you got the night before. Having trouble waking up. Others may find it difficult to wake you up when you are sleeping. Sleeping for longer and longer periods at a time. Taking naps throughout the day. Other symptoms may include: Feeling restless, anxious, or annoyed. Lacking energy. Having trouble with: Remembering. Speaking. Thinking. Loss of appetite. Seeing, hearing, tasting, smelling, or feeling things that are not real (hallucinations). How is this diagnosed? This condition may be diagnosed based on: Your symptoms and medical history. Your sleeping habits. Your health care provider may ask you to write down your sleeping habits in a daily sleep log, along with any symptoms you have. A series of tests that are done while you sleep (sleep study or polysomnogram). A test that measures how quickly you can fall asleep during the day (daytime nap study  or multiple sleep latency test). How is this treated? Treatment can help you manage your condition. Treatment may include: Following a regular sleep routine. Lifestyle changes, such as changing your eating habits, getting regular exercise, and avoiding alcohol or caffeinated beverages. Taking medicines to make you more alert (stimulants) during the day. Treating any underlying medical causes of hypersomnia. Follow these instructions at home: Sleep routine  Schedule the same bedtime and wake-up time each day. Practice a relaxing bedtime routine. This may include reading, meditation, deep breathing, or taking a warm bath before going to sleep. Get regular exercise each day. Avoid strenuous exercise in the evening hours. Keep your sleep environment at a cooler temperature, darkened, and quiet. Sleep with pillows and a mattress that are comfortable and supportive. Schedule short 20-minute naps for when you feel sleepiest during the day. Talk with your employer or teachers about your hypersomnia. If possible, adjust your schedule so that: You have a regular daytime work schedule. You can take a scheduled nap during the day. You do not have to work or be active at night. Do not eat a heavy meal for a few hours before bedtime. Eat your meals at about the same times every day. Avoid drinking alcohol or caffeinated beverages. Safety  Do not drive or use heavy machinery if you are sleepy. Ask your health care provider if it is  safe for you to drive. Wear a life jacket when swimming or spending time near water. General instructions Take supplements and over-the-counter and prescription medicines only as told by your health care provider. Keep a sleep log that will help your doctor manage your condition. This may include information about: What time you go to bed each night. How often you wake up at night. How many hours you sleep at night. How often and for how long you nap during the day. Any  observations from others, such as leg movements during sleep, sleep walking, or snoring. Keep all follow-up visits as told by your health care provider. This is important. Contact a health care provider if: You have new symptoms. Your symptoms get worse. Get help right away if: You have serious thoughts about hurting yourself or someone else. If you ever feel like you may hurt yourself or others, or have thoughts about taking your own life, get help right away. You can go to your nearest emergency department or call: Your local emergency services (911 in the [place] A suicide crisis helpline, such as the [facility] at 901-127-6856. This is open 24 hours a day. Summary Hypersomnia refers to a condition in which you feel very tired during the day even though you get plenty of sleep at night. A person with this condition may take naps during the day and may find it very difficult to wake up from sleep. Hypersomnia may affect a person's ability to think, concentrate, drive, or remember things. Treatment, such as following a regular sleep routine and making some lifestyle changes, can help you manage your condition. This information is not intended to replace advice given to you by your health care provider. Make sure you discuss any questions you have with your health care provider. Document Revised: 01/31/2020 Document Reviewed: 01/31/2020 Elsevier Patient Education  2022 [employer].

## 2020-12-10 NOTE — Progress Notes (Signed)
SLEEP MEDICINE CLINIC    Provider:  Larey Seat, MD  Primary Care Physician:  Colon Branch, [address] [place] [address]     Referring Provider: Colon Branch, [address],  [place] [address]     Primary neurologist ; Dr Posey Pronto, DO.          Chief Complaint according to patient   Patient presents with:     New Patient (Initial Visit)     Here for internal referral for sleep apnea. No prior SS, pt self dx himself w OSA and bought a CPAP online. Has been using it for a few months and has noticed his fast heart palpitations had improved when using CPAP. Pt reported initially he was feeling less tired, recently he has started back with fatigue and low energy.       HISTORY OF PRESENT ILLNESS:  Donald [name] is a 65 y.o. year old Caucasian male patient seen here as a referral on 12/10/2020 from Dr. Larose Kells, MD for a sleep evaluation. .  Chief concern according to patient :  "I had already a CPAP machine that I ordered online, no medical prescription. " Here for internal referral for sleep apnea. No prior SS, pt self dx himself w OSA and bought a CPAP online. Has been using it for a few months and has noticed his fast heart palpitations had improved when using CPAP. Pt reported initially he was feeling less tired, recently he has started back with fatigue and low energy.    I have the pleasure of seeing Donald [name] today, a right-handed White or Caucasian male with a possible sleep disorder. He  has a past medical history of Blepharitis, Cellulitis (2015), ED (erectile dysfunction) (10/08/2011), HNP (herniated nucleus pulposus), Hyperlipidemia, and Prostate cancer ([place]). Donald [name] presented to for a regular yearly physical to his primary care physician and had already self referred to cardiology for palpitations, he was then seen by cardiology upon self-referral or request referral and diagnosed with a high likelihood of having sleep  apnea based on his cardiac monitor , he had no atrial fib PVCs. He referred himself to [facility]. Had ZIO patch there, echo and a cardic calcium CT .   Palpitations   Tachycardia     Procedures   [facility]   Alyssa Grove, MD   [place], [place] [address]   Phone: [phone]   Fax: [phone]       No formal sleep test prior.    Sleep relevant medical history:, Tonsillectomy.    Family medical /sleep history: brother  on CPAP with OSA.    Social history:  Patient is working as Engineer, maintenance (IT), office work,  and lives in a household with spouse, 35 year old bulldog, Tobacco use; cigars, 1/ week.  ETOH use : 2 drinks a day.  , Caffeine intake in form of Coffee(  1 AM ) Soda( /) Tea ( /) or energy drinks. Regular exercise in form of gym visit 3/ weekly.   Hobbies : golfing       Sleep habits are as follows: The patient's dinner time is between 5-6 PM.  The patient goes to bed at 9.30  PM and continues to sleep for 3 hours, wakes for one bathroom breaks, the first time at 1-2 AM.  He may get up to  8 hours of sleep, the bedroom is cool, but TV is running.  The preferred sleep position is variable , with the support of 1 pillow, flat bed. Dreams are reportedly infrequent/vivid.  5.30  AM is the usual rise time. The patient wakes up spontaneously. He reports not feeling refreshed or restored in AM, with symptoms such as dry mouth and residual fatigue.  Naps are taken infrequently, lasting from 15 to 30 minutes and are more refreshing than nocturnal sleep.    Review of Systems: Out of a complete 14 system review, the patient complains of only the following symptoms, and all other reviewed systems are negative.:  Fatigue, sleepiness , snoring, fragmented sleep, Insomnia - light sleep, restless sleep.    How likely are you to doze in the following situations: 0 = not likely, 1 = slight chance, 2 = moderate chance, 3 = high chance    Sitting and Reading? Watching Television? Sitting inactive in a public place (theater or meeting)? As a passenger in a car for an hour without a break? Lying down in the afternoon when circumstances permit? Sitting and talking to someone? Sitting quietly after lunch without alcohol? In a car, while stopped for a few minutes in traffic?   Total = 13/ 24 points   FSS endorsed at 56/ 63 points.  GDS: 2/ 15 points.   Social History   Socioeconomic History   Marital status: Married    Spouse name: Not on file   Number of children: 0   Years of education: Not on file   Highest education level: Not on file  Occupational History   Occupation: cpa   Tobacco Use   Smoking status: Never   Smokeless tobacco: Never  Substance and Sexual Activity   Alcohol use: Yes    Alcohol/week: 6.0 standard drinks    Types: 6 drink(s) per week    Comment: 2-3 drinks most days   Drug use: No   Sexual activity: Yes    Partners: Female  Other Topics Concern   Not on file  Social History Narrative   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    Oldest step dtr died of suicide.    Marriage in good health.   Social Determinants of Health   Financial Resource Strain: Not on file  Food Insecurity: Not on file  Transportation Needs: Not on file  Physical Activity: Not on file  Stress: Not on file  Social Connections: Not on file    Family History  Problem Relation Age of Onset   Stroke Father 75   Other Father        tobacco abuse   Alzheimer's disease Mother    Coronary artery disease Sister 3       MI and sudden 2 age 20   Obesity Sister    Other Sister        tobacco abuse   Diabetes Paternal Grandmother    Prostate cancer Brother    Lymphoma Brother        ?? gastric   Colon cancer Neg Hx     Past Medical History:  Diagnosis Date   Blepharitis    Cellulitis 2015    Eyelid, Jeralene Huff   ED (erectile dysfunction) 10/08/2011   Following nerve sparing robotic prostatectomy he has decreased tumescence    HNP (herniated nucleus pulposus)    lumbar spine  Hyperlipidemia    mild   Prostate cancer [facility])     Past Surgical History:  Procedure Laterality Date   BACK SURGERY     ~ 2005   PROSTATECTOMY     robotic '08 at [facility]       Current Outpatient Medications on File Prior to Visit  Medication Sig Dispense Refill   aspirin EC 81 MG tablet Take 1 tablet (81 mg total) by mouth daily. 150 tablet 2   atorvastatin (LIPITOR) 20 MG tablet Take 1 tablet (20 mg total) by mouth at bedtime. 90 tablet 3   cetirizine (ZYRTEC) 10 MG tablet Take 10 mg by mouth daily as needed.       No current facility-administered medications on file prior to visit.    No Known Allergies  Physical exam:  Today's Vitals   12/10/20 0955  BP: 127/80  Pulse: 80  Weight: 263 lb 8 oz (119.5 kg)  Height: '6\' 2"'$  (1.88 m)   Body mass index is 33.83 kg/m.   Wt Readings from Last 3 Encounters:  12/10/20 263 lb 8 oz (119.5 kg)  10/01/20 267 lb 2 oz (121.2 kg)  03/14/19 253 lb (114.8 kg)     Ht Readings from Last 3 Encounters:  12/10/20 '6\' 2"'$  (1.88 m)  10/01/20 '6\' 2"'$  (1.88 m)  03/14/19 '6\' 2"'$  (1.88 m)      General: The patient is awake, alert and appears not in acute distress. The patient is well groomed. Head: Normocephalic, atraumatic. Neck is supple. Mallampati 1 , soft tissue excess. ,  neck circumference:18.5 inches .  Nasal airflow barely  patent.  Retrognathia is not seen.  Dental status: intact.  Cardiovascular:  Regular rate and cardiac rhythm by pulse,  without distended neck veins. Respiratory: Lungs are clear to auscultation.  Skin:  Without evidence of ankle edema, or rash. Trunk: The patient's posture is erect.   Neurologic exam : The patient is awake and alert, oriented to place and time.   Memory subjective described as intact.   Attention span & concentration ability appears normal.  Speech is fluent,  without  dysarthria, dysphonia or aphasia.  Mood and affect are appropriate.   Cranial nerves: gradual loss of smell reported : Extraocular movements in vertical and horizontal planes were intact and without nystagmus. No Diplopia. Visual fields by finger perimetry are intact. Hearing was intact to soft voice and finger rubbing.    Facial sensation intact to fine touch.  Facial motor strength is symmetric and tongue and uvula move midline.  Neck ROM : rotation, tilt and flexion extension were normal for age and shoulder shrug was symmetrical.    Motor exam:  Symmetric bulk, tone and ROM.   Normal tone without cog wheeling, symmetric grip strength .   Sensory:  Fine touch, pinprick and vibration were numb in both feet.  Proprioception tested in the upper extremities was normal.   Coordination: Rapid alternating movements in the fingers/hands were of normal speed.  The Finger-to-nose maneuver was intact without evidence of ataxia, dysmetria or tremor.   Gait and station: Patient could rise unassisted from a seated position, walked without assistive device.  Stance is of normal width/ base and the patient turned with 3 steps.  Toe and heel walk were deferred.  Deep tendon reflexes: in the  upper extremities are symmetric and intact.  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  Has brisker patella reflex on the right than left  Babinski response was deferred.  After spending a total time of  45  minutes face to face and additional time for physical and neurologic examination, review of laboratory studies,  personal review of imaging studies, reports and results of other testing and review of referral information / records as far as provided in visit, I have established the following assessments:  1)  patient is self treating with CPAP, this machine was never supervised  or set but he uses it and feels better, keeps nasal  passage open. Allows for supine sleep.  He drinks alcohol.  2) excessive daytime sleepiness 3) PVC on EKG- cardiac monitor ruled out Atrial fibrillation.    My Plan is to proceed with:  1) HST for screening and his CPAP is now over 65 years old, he is entitled to a new one. Uses nasal pillows and likes them.  2) sleep hygiene, alcohol.     I would like to thank Colon Branch, MD and Colon Branch, [address],  [place] [address] for allowing me to meet with and to take care of this pleasant patient.   In short, Donald [name] is presenting with witnessed and recorded apneas. CC: I will share my notes with PCP. Marland Kitchen  Electronically signed by: Larey Seat, MD 12/10/2020 10:29 AM  [facility] Board certified by The [employer] of Sleep Medicine and Diplomate of the [employer] of Sleep Medicine. Board certified In Neurology through the [place], Fellow of the [employer] of Neurology. Medical Director of [employer].

## 2020-12-15 ENCOUNTER — Telehealth: Payer: Self-pay

## 2020-12-15 NOTE — Telephone Encounter (Signed)
Patient called back and left voicemail, requested if possible to send dates via message to him. Sent MyChart message to patient with availability.

## 2020-12-15 NOTE — Telephone Encounter (Signed)
LVM for pt to call me back to schedule sleep study  

## 2020-12-31 ENCOUNTER — Encounter (INDEPENDENT_AMBULATORY_CARE_PROVIDER_SITE_OTHER): Payer: Medicare Other | Admitting: Ophthalmology

## 2020-12-31 ENCOUNTER — Other Ambulatory Visit: Payer: Self-pay

## 2020-12-31 ENCOUNTER — Ambulatory Visit (INDEPENDENT_AMBULATORY_CARE_PROVIDER_SITE_OTHER): Payer: Medicare Other | Admitting: Neurology

## 2020-12-31 DIAGNOSIS — H33303 Unspecified retinal break, bilateral: Secondary | ICD-10-CM

## 2020-12-31 DIAGNOSIS — R0981 Nasal congestion: Secondary | ICD-10-CM

## 2020-12-31 DIAGNOSIS — I493 Ventricular premature depolarization: Secondary | ICD-10-CM

## 2020-12-31 DIAGNOSIS — G4733 Obstructive sleep apnea (adult) (pediatric): Secondary | ICD-10-CM

## 2020-12-31 DIAGNOSIS — G4730 Sleep apnea, unspecified: Secondary | ICD-10-CM

## 2020-12-31 DIAGNOSIS — H43813 Vitreous degeneration, bilateral: Secondary | ICD-10-CM | POA: Diagnosis not present

## 2020-12-31 DIAGNOSIS — F40240 Claustrophobia: Secondary | ICD-10-CM

## 2020-12-31 DIAGNOSIS — R0683 Snoring: Secondary | ICD-10-CM

## 2020-12-31 DIAGNOSIS — G4710 Hypersomnia, unspecified: Secondary | ICD-10-CM

## 2021-01-05 DIAGNOSIS — L905 Scar conditions and fibrosis of skin: Secondary | ICD-10-CM | POA: Diagnosis not present

## 2021-01-05 DIAGNOSIS — L814 Other melanin hyperpigmentation: Secondary | ICD-10-CM | POA: Diagnosis not present

## 2021-01-05 DIAGNOSIS — D2262 Melanocytic nevi of left upper limb, including shoulder: Secondary | ICD-10-CM | POA: Diagnosis not present

## 2021-01-05 DIAGNOSIS — L578 Other skin changes due to chronic exposure to nonionizing radiation: Secondary | ICD-10-CM | POA: Diagnosis not present

## 2021-01-05 DIAGNOSIS — Q828 Other specified congenital malformations of skin: Secondary | ICD-10-CM | POA: Diagnosis not present

## 2021-01-05 DIAGNOSIS — D225 Melanocytic nevi of trunk: Secondary | ICD-10-CM | POA: Diagnosis not present

## 2021-01-05 DIAGNOSIS — Z23 Encounter for immunization: Secondary | ICD-10-CM | POA: Diagnosis not present

## 2021-01-05 DIAGNOSIS — Z87898 Personal history of other specified conditions: Secondary | ICD-10-CM | POA: Diagnosis not present

## 2021-01-05 DIAGNOSIS — L570 Actinic keratosis: Secondary | ICD-10-CM | POA: Diagnosis not present

## 2021-01-15 NOTE — Progress Notes (Signed)
[facility] TEST REPORT ( by Watch PAT)   STUDY DATE:  loaded on 01-15-2021 DOB:  1955-08-19 MRN: [id number]   ORDERING CLINICIAN: Larey Seat, MD  REFERRING CLINICIAN: Dr. Larose Kells, MD Primary Neurologist is Danika Patel,DO    CLINICAL INFORMATION/HISTORY: 12-10-2020: Sleep Consultation upon request from Dr. Larose Kells, MD.  Chief concern according to patient :  "I had already a CPAP machine that I ordered online, without medical prescription. " The patient had diagnosed himself with OSA and bought a CPAP online. Has been using it for 4 months and noticed his heart palpitations had improved while using CPAP. He reported initially feeling less tired, recently he has again felt more fatigue and low energy.  Medical history of: Blepharitis, Cellulitis (2015), ED (erectile dysfunction) (10/08/2011), HNP (herniated nucleus pulposus), Hyperlipidemia, and Prostate cancer ([place]). Mr.Murguia presented for a regular yearly physical to his primary care physician and had at that point already self-referred himself to [facility] cardiology for evaluation of palpitations, where he was then seen and diagnosed with a high likelihood of having sleep apnea based on his cardiac monitor results , but he had no atrial fib, only PVCs. He had a ZIO patch there, echo and a cardic calcium CT .    Epworth sleepiness score: 13/24.   BMI: 33.7 kg/m   Neck Circumference:    FINDINGS:   Sleep Summary:   Total Recording Time (hours, min): The overall recording time of this home sleep test device was 7 hours and 57 minutes of which 7 hours and 13 minutes were calculated total sleep time.  REM sleep amounted to 16.4% of total sleep time.                              Respiratory Indices:   Calculated pAHI (per hour):   The calculated apnea hypopnea index was 32.2/h in rem sleep increased to 44.5/h and in non-REM sleep the AHI was 29.8/h.  There was a slightly higher RDI than AHI noted indicating  the presence of snoring.  Positional data for apnea show a supine AHI of 44.7 and a nonsupine AHI of 24.5/h.   While the highest AHI was seen in supine position, the lowest AHI was noted when sleeping in the left lateral position.  Left lateral AHI was 2.9/h.                         Snoring was recorded as moderately- loud with a mean volume of 41 dB.  Snoring was present for 23% of the total sleep time.  The highest volume of snoring exceeded 60 dB.                                          Oxygen Saturation Statistics:     O2 Saturation Range (%): Oxygen saturation ranged between a nadir at 82% and a maximum of 98% with a mean oxygenation-saturation of 92%.                                      O2 Saturation (minutes) <89%: Total time in hypoxia was 7.8 minutes, the equivalent of 1.8% of total sleep time.  Pulse Rate Statistics:   Pulse Range: Varied between 53 bpm and a maximum heart rate of 98 bpm with a mean heart rate of 71 bpm.  Please note that this home sleep test can only give information about cardiac rate but not cardiac rhythm.               IMPRESSION:  This HST confirms the presence of severe sleep apnea with REM sleep accentuation and supine sleep accentuation.  There was no significant time in hypoxia.  Heart rate varied more significantly during REM sleep and the frequency of brief drops in oxygen saturation was much higher during REM sleep as well.   RECOMMENDATION: This patient would be best served with positive airway pressure therapy, and auto titration capable CPAP device will be provided with heated humidification, and interface of patient's choice, and a setting between 7 and 17 cmH2O with 2 cm EPR.  Rv with NP in 2-4 month, after 60-90 days on CPAP therapy.     INTERPRETING PHYSICIAN:   Larey Seat, MD   Medical Director of [facility] at [facility].

## 2021-01-16 NOTE — Procedures (Signed)
HOME SLEEP TEST REPORT ( by Watch PAT)   STUDY DATE:  loaded on 01-15-2021 DOB:  Mar 25, 1956 MRN: [id number]   ORDERING CLINICIAN: Larey Seat, MD  REFERRING CLINICIAN: Dr. Larose Kells, MD Primary Neurologist is Danika Patel,DO    CLINICAL INFORMATION/HISTORY: 12-10-2020: Sleep Consultation upon request from Dr. Larose Kells, MD.  Chief concern according to patient :  "I had already a CPAP machine that I ordered online, without medical prescription. " The patient had diagnosed himself with OSA and bought a CPAP online. Has been using it for 4 months and noticed his heart palpitations had improved while using CPAP. He reported initially feeling less tired, recently he has again felt more fatigue and low energy.  Medical history of: Blepharitis, Cellulitis (2015), ED (erectile dysfunction) (10/08/2011), HNP (herniated nucleus pulposus), Hyperlipidemia, and Prostate cancer ([place]). Donald [name] presented for a regular yearly physical to his primary care physician and had at that point already self-referred himself to [facility]ology for evaluation of palpitations, where he was then seen and diagnosed with a high likelihood of having sleep apnea based on his cardiac monitor results , but he had no atrial fib, only PVCs. He had a ZIO patch there, echo and a cardic calcium CT .  The patient advised me of his poor nasal airflow and claustrophobia.     Epworth sleepiness score: 13/24.   BMI: 33.7 kg/m   Neck Circumference:    FINDINGS:   Sleep Summary:   Total Recording Time (hours, min): The overall recording time of this home sleep test device was 7 hours and 57 minutes of which 7 hours and 13 minutes were calculated total sleep time.  REM sleep amounted to 16.4% of total sleep time.                              Respiratory Indices:   Calculated pAHI (per hour):   The calculated apnea hypopnea index was 32.2/h in rem sleep increased to 44.5/h and in non-REM sleep the AHI was 29.8/h.  There was a slightly  higher RDI than AHI noted indicating the presence of snoring.  Positional data for apnea show a supine AHI of 44.7 and a nonsupine AHI of 24.5/h.   While the highest AHI was seen in supine position, the lowest AHI was noted when sleeping in the left lateral position.  Left lateral AHI was 2.9/h.                         Snoring was recorded as moderately- loud with a mean volume of 41 dB.  Snoring was present for 23% of the total sleep time.  The highest volume of snoring exceeded 60 dB.                                          Oxygen Saturation Statistics:     O2 Saturation Range (%): Oxygen saturation ranged between a nadir at 82% and a maximum of 98% with a mean oxygenation-saturation of 92%.                                      O2 Saturation (minutes) <89%: Total time in hypoxia was 7.8 minutes, the equivalent of 1.8% of total sleep time.  Pulse Rate Statistics:   Pulse Range: Varied between 53 bpm and a maximum heart rate of 98 bpm with a mean heart rate of 71 bpm.  Please note that this home sleep test can only give information about cardiac rate but not cardiac rhythm.               IMPRESSION:  This HST confirms the presence of severe sleep apnea with REM sleep accentuation and supine sleep accentuation.  There was no significant time in hypoxia.  Heart rate varied more significantly during REM sleep and the frequency of brief drops in oxygen saturation was much higher during REM sleep as well.   RECOMMENDATION: This patient would be best served with positive airway pressure therapy, and auto titration capable CPAP device will be provided with heated humidification, and interface of patient's choice, and a setting between 7 and 17 cmH2O with 2 cm EPR. The patient advised me of his poor nasal airflow and claustrophobia. If nasal CPAP is not well tolerated, will refer to ENT for treatment.     Rv with NP in 2-4 month, after 60-90 days on CPAP therapy.     INTERPRETING  PHYSICIAN:   Larey Seat, MD   Medical Director of [facility] Sleep at [facility].

## 2021-01-16 NOTE — Progress Notes (Signed)
IMPRESSION:  This HST confirms the presence of severe sleep apnea with REM sleep accentuation and supine sleep accentuation.  There was no significant time in hypoxia.  Heart rate varied more significantly during REM sleep and the frequency of brief drops in oxygen saturation was much higher during REM sleep as well.  RECOMMENDATION: This patient would be best served with positive airway pressure therapy, and auto titration capable CPAP device will be provided with heated humidification, and interface of patient's choice, and a setting between 7 and 17 cmH2O with 2 cm EPR. The patient advised me of his poor nasal airflow and claustrophobia. If nasal CPAP is not well tolerated, will refer to ENT/PCP for sinus treatment.    Rv with NP in 2-4 month, after 60-90 days on CPAP therapy.   CC Dr Posey Pronto and Dr Larose Kells.   INTERPRETING PHYSICIAN:

## 2021-01-16 NOTE — Addendum Note (Signed)
Addended by: Larey Seat on: 01/16/2021 02:46 PM   Modules accepted: Orders

## 2021-01-19 ENCOUNTER — Telehealth: Payer: Self-pay | Admitting: Neurology

## 2021-01-19 NOTE — Telephone Encounter (Signed)
Called patient to discuss sleep study results. No answer at this time. LVM for the patient to call back.   

## 2021-01-19 NOTE — Telephone Encounter (Signed)
-----   Message from Larey Seat, MD sent at 01/16/2021  2:46 PM EDT ----- IMPRESSION:  This HST confirms the presence of severe sleep apnea with REM sleep accentuation and supine sleep accentuation.  There was no significant time in hypoxia.  Heart rate varied more significantly during REM sleep and the frequency of brief drops in oxygen saturation was much higher during REM sleep as well.  RECOMMENDATION: This patient would be best served with positive airway pressure therapy, and auto titration capable CPAP device will be provided with heated humidification, and interface of patient's choice, and a setting between 7 and 17 cmH2O with 2 cm EPR. The patient advised me of his poor nasal airflow and claustrophobia. If nasal CPAP is not well tolerated, will refer to ENT/PCP for sinus treatment.    Rv with NP in 2-4 month, after 60-90 days on CPAP therapy.   CC Dr Posey Pronto and Dr Larose Kells.   INTERPRETING PHYSICIAN:

## 2021-01-20 ENCOUNTER — Encounter: Payer: Self-pay | Admitting: Neurology

## 2021-01-20 ENCOUNTER — Encounter: Payer: Self-pay | Admitting: *Deleted

## 2021-01-20 NOTE — Telephone Encounter (Signed)
I called pt. I advised pt that Dr. Brett Fairy reviewed their sleep study results and found that pt has severe sleep apnea. Dr. Brett Fairy recommends that pt start cpap. I reviewed PAP compliance expectations with the pt. Pt is agreeable to starting a CPAP. I advised pt that an order will be sent to a DME, Teresita will call the pt within about one week after they file with the pt's insurance. [employer] will show the pt how to use the machine, fit for masks, and troubleshoot the CPAP if needed. A follow up appt was made for insurance purposes with Dr. Brett Fairy on 04/28/21 at 1:30pm. Pt verbalized understanding to arrive 15 minutes early and bring their CPAP. A letter with all of this information in it will be mailed to the pt as a reminder. I verified with the pt that the address we have on file is correct. Pt verbalized understanding of results. Pt had no questions at this time but was encouraged to call back if questions arise. I have sent the order to [employer] and have received confirmation that they have received the order.

## 2021-02-05 DIAGNOSIS — G4733 Obstructive sleep apnea (adult) (pediatric): Secondary | ICD-10-CM | POA: Diagnosis not present

## 2021-02-09 DIAGNOSIS — E782 Mixed hyperlipidemia: Secondary | ICD-10-CM | POA: Diagnosis not present

## 2021-02-09 DIAGNOSIS — Z0000 Encounter for general adult medical examination without abnormal findings: Secondary | ICD-10-CM | POA: Diagnosis not present

## 2021-02-09 DIAGNOSIS — Z23 Encounter for immunization: Secondary | ICD-10-CM | POA: Diagnosis not present

## 2021-02-17 DIAGNOSIS — Z0000 Encounter for general adult medical examination without abnormal findings: Secondary | ICD-10-CM | POA: Diagnosis not present

## 2021-03-07 DIAGNOSIS — G4733 Obstructive sleep apnea (adult) (pediatric): Secondary | ICD-10-CM | POA: Diagnosis not present

## 2021-04-28 ENCOUNTER — Encounter: Payer: Self-pay | Admitting: Neurology

## 2021-04-28 ENCOUNTER — Ambulatory Visit: Payer: Medicare Other | Admitting: Neurology

## 2021-04-28 VITALS — BP 114/70 | HR 79 | Ht 74.0 in | Wt 268.5 lb

## 2021-04-28 DIAGNOSIS — G4733 Obstructive sleep apnea (adult) (pediatric): Secondary | ICD-10-CM

## 2021-04-28 NOTE — Progress Notes (Signed)
SLEEP MEDICINE CLINIC    Provider:  Larey Seat, MD  Primary Care Physician:  Colon Branch, [address] [place] [address]     Referring Provider: Colon Branch, [address] [place],  [place] [address]     Primary neurologist ; Dr Posey Pronto, DO.          Chief Complaint according to patient   Patient presents with:     New Patient (Initial Visit)     Here for internal referral for sleep apnea. Here for initial CPAP f/u. Pt reports doing well. No issues or concerns. Has been getting good sleep.   New primary MD : CC Kip Corrington, MD       HISTORY OF PRESENT ILLNESS:  Donald [name] is a 66 y.o. year old Caucasian male patient seen here in  a RV after sleep test and with a new CPAP 1 -24-2023.  The patient used to that his CPAP for an average of 7 hours and 39 minutes on 25 out of 30 days.  Compliance by hours was 80%.  AutoSet is between 7 and 17 cmH2O was 2 cm EPR.  95th percentile of CPAP therapy was 10.5 cm water.  The residual apnea is now 0.8/h.  This is a great resolution of apnea his home sleep test had shown severe AHI of 32.2/h in rem sleep of 44.5/h.  And in supine sleep 44.7/h.  Home sleep test was dated 01-15-2021.    Based on this I will just have the patient continue with his CPAP the settings do not need to be changed heated humidification is provided.  It is the patient's choice if he wants to use humidifier or not.    I would see him again in follow-up in a year.  He benefits from therapy. Current Epworth sleepiness score is 5 points well below the original presentation and the patient had endorsed 13 points on the Epworth Sleepiness Scale.  His fatigue score also is reduced to 22 out of 63 points.           04/28/2021 from Dr. Larose Kells, MD for a sleep evaluation. .  Chief concern according to patient :  "I had already a CPAP machine that I ordered online, no medical prescription. " Here for internal referral for  sleep apnea. No prior SS, pt self dx himself w OSA and bought a CPAP online. Has been using it for a few months and has noticed his fast heart palpitations had improved when using CPAP. Pt reported initially he was feeling less tired, recently he has started back with fatigue and low energy.    I have the pleasure of seeing Donald [name] today, a right-handed White or Caucasian male with a possible sleep disorder. He  has a past medical history of Blepharitis, Cellulitis (2015), ED (erectile dysfunction) (10/08/2011), HNP (herniated nucleus pulposus), Hyperlipidemia, and Prostate cancer ([place]). Mr.Ebbs presented to for a regular yearly physical to his primary care physician and had already self referred to cardiology for palpitations, he was then seen by cardiology upon self-referral or request referral and diagnosed with a high likelihood of having sleep apnea based on his cardiac monitor , he had no atrial fib PVCs. He referred himself to [facility]. Had ZIO patch there, echo and a cardic calcium CT .   Palpitations   Tachycardia     Procedures   CUS TTE SURFACE  ECHO ADULT   Alyssa Grove, MD   [place] [address]   [address]   [place],  [address]   Phone: [phone]   Fax: [phone]       No formal sleep test prior.    Sleep relevant medical history:, Tonsillectomy.    Family medical /sleep history: brother  on CPAP with OSA.    Social history:  Patient is working as Engineer, maintenance (IT), office work,  and lives in a household with spouse, 63 year old bulldog, Tobacco use; cigars, 1/ week.  ETOH use : 2 drinks a day.  , Caffeine intake in form of Coffee(  1 AM ) Soda( /) Tea ( /) or energy drinks. Regular exercise in form of gym visit 3/ weekly.   Hobbies : golfing       Sleep habits are as follows: The patient's dinner time is between 5-6 PM.  The patient goes to bed at 9.30  PM and continues to sleep for 3 hours, wakes for one bathroom breaks, the first time at 1-2 AM.  He may  get up to 8 hours of sleep, the bedroom is cool, but TV is running.  The preferred sleep position is variable , with the support of 1 pillow, flat bed. Dreams are reportedly infrequent/vivid.  5.30  AM is the usual rise time. The patient wakes up spontaneously. He reports not feeling refreshed or restored in AM, with symptoms such as dry mouth and residual fatigue.  Naps are taken infrequently, lasting from 15 to 30 minutes and are more refreshing than nocturnal sleep.    Review of Systems: Out of a complete 14 system review, the patient complains of only the following symptoms, and all other reviewed systems are negative.:  Fatigue, sleepiness , snoring, fragmented sleep, Insomnia - light sleep, restless sleep.    How likely are you to doze in the following situations: 0 = not likely, 1 = slight chance, 2 = moderate chance, 3 = high chance   Sitting and Reading? Watching Television? Sitting inactive in a public place (theater or meeting)? As a passenger in a car for an hour without a break? Lying down in the afternoon when circumstances permit? Sitting and talking to someone? Sitting quietly after lunch without alcohol? In a car, while stopped for a few minutes in traffic?   Total = 5 from pre CPAP level of 13 / 24 points   FSS endorsed at  22 points down from 56/ 63 points.  GDS: 2/ 15 points.  Nocturia , none  Social History   Socioeconomic History   Marital status: Married    Spouse name: Not on file   Number of children: 0   Years of education: Not on file   Highest education level: Not on file  Occupational History   Occupation: cpa   Tobacco Use   Smoking status: Never   Smokeless tobacco: Never  Substance and Sexual Activity   Alcohol use: Yes    Alcohol/week: 6.0 standard drinks    Types: 6 drink(s) per week    Comment: 2-3 drinks most days   Drug use: No   Sexual activity: Yes    Partners: Female  Other Topics Concern   Not on file  Social History  Narrative   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    Oldest step dtr died of suicide.    Marriage in  good health.   Social Determinants of Radio broadcast assistant Strain: Not on file  Food Insecurity: Not on file  Transportation Needs: Not on file  Physical Activity: Not on file  Stress: Not on file  Social Connections: Not on file    Family History  Problem Relation Age of Onset   Stroke Father 54   Other Father        tobacco abuse   Alzheimer's disease Mother    Coronary artery disease Sister 72       MI and sudden 83 age 52   Obesity Sister    Other Sister        tobacco abuse   Diabetes Paternal Grandmother    Prostate cancer Brother    Lymphoma Brother        ?? gastric   Colon cancer Neg Hx     Past Medical History:  Diagnosis Date   Blepharitis    Cellulitis 2015   Eyelid, Jeralene Huff   ED (erectile dysfunction) 10/08/2011   Following nerve sparing robotic prostatectomy he has decreased tumescence    HNP (herniated nucleus pulposus)    lumbar spine   Hyperlipidemia    mild   Prostate cancer [facility])     Past Surgical History:  Procedure Laterality Date   BACK SURGERY     ~ 2005   PROSTATECTOMY     robotic '08 at [facility]       Current Outpatient Medications on File Prior to Visit  Medication Sig Dispense Refill   aspirin EC 81 MG tablet Take 1 tablet (81 mg total) by mouth daily. 150 tablet 2   atorvastatin (LIPITOR) 20 MG tablet Take 1 tablet (20 mg total) by mouth at bedtime. 90 tablet 3   cetirizine (ZYRTEC) 10 MG tablet Take 10 mg by mouth daily as needed.       No current facility-administered medications on file prior to visit.    No Known Allergies  Physical exam:  Today's Vitals   04/28/21 1347  BP: 114/70  Pulse: 79  SpO2: 97%  Weight: 268 lb 8 oz (121.8 kg)  Height: 6\' 2"  (1.88 m)   Body mass index is  34.47 kg/m.   Wt Readings from Last 3 Encounters:  04/28/21 268 lb 8 oz (121.8 kg)  12/10/20 263 lb 8 oz (119.5 kg)  10/01/20 267 lb 2 oz (121.2 kg)     Ht Readings from Last 3 Encounters:  04/28/21 6\' 2"  (1.88 m)  12/10/20 6\' 2"  (1.88 m)  10/01/20 6\' 2"  (1.88 m)      General: The patient is awake, alert and appears not in acute distress. The patient is well groomed. Head: Normocephalic, atraumatic. Neck is supple. Mallampati 1 , soft tissue excess. ,  neck circumference:18.5 inches .  Nasal airflow barely  patent.  Retrognathia is not seen.  Dental status: intact.  Cardiovascular:  Regular rate and cardiac rhythm by pulse,  without distended neck veins. Respiratory: Lungs are clear to auscultation.  Skin:  Without evidence of ankle edema, or rash. Trunk: The patient's posture is erect.   Neurologic exam : The patient is awake and alert, oriented to place and time.   Memory subjective described as intact.  Attention span & concentration ability appears normal.  Speech is fluent,  without  dysarthria, dysphonia or aphasia.  Mood and affect are appropriate.   Cranial nerves: gradual loss of smell reported : Extraocular movements in vertical and horizontal planes  were intact and without nystagmus. No Diplopia. Visual fields by finger perimetry are intact. Hearing was intact to soft voice and finger rubbing.    Facial sensation intact to fine touch.  Facial motor strength is symmetric and tongue and uvula move midline.  Neck ROM : rotation, tilt and flexion extension were normal for age and shoulder shrug was symmetrical.    Motor exam:  Symmetric bulk, tone and ROM.   Normal tone without cog wheeling, symmetric grip strength .   Sensory:  Fine touch, pinprick and vibration were numb in both feet.  Proprioception tested in the upper extremities was normal.   Coordination: Rapid alternating movements in the fingers/hands were of normal speed.  The Finger-to-nose maneuver was  intact without evidence of ataxia, dysmetria or tremor.   Gait and station: Patient could rise unassisted from a seated position, walked without assistive device.  Stance is of normal width/ base and the patient turned with 3 steps.  Toe and heel walk were deferred.  Deep tendon reflexes: in the  upper extremities are symmetric and intact.  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  Has brisker patella reflex on the right than left  Babinski response was deferred.       After spending a total time of 15 minutes face to face and additional time for physical and neurologic examination, review of laboratory studies,  personal review of imaging studies, reports and results of other testing and review of referral information / records as far as provided in visit, I have established the following assessments:  1)  severe OSA without clinically relevant hypoxia.   My Plan is to proceed with:CPAP use at current settings. The patient has demonstrated  clinical results.  2) sleep hygiene, alcohol.  R V in 12 months with NP.    I would like to thank Colon Branch, MD and Colon Branch, [address] [place],  [place] [address] for allowing me to meet with and to take care of this pleasant patient.    Electronically signed by: Larey Seat, MD 04/28/2021 1:52 PM  [facility] Board certified by The [employer] of Sleep Medicine and Diplomate of the [employer] of Sleep Medicine. Board certified In Neurology through the [place], Fellow of the [employer] of Neurology. Medical Director of [employer] [facility].

## 2021-04-28 NOTE — Patient Instructions (Signed)

## 2021-08-03 ENCOUNTER — Emergency Department (HOSPITAL_BASED_OUTPATIENT_CLINIC_OR_DEPARTMENT_OTHER)
Admission: EM | Admit: 2021-08-03 | Discharge: 2021-08-03 | Disposition: A | Discharge status: Home / Self Care | Payer: Medicare Other | Attending: Emergency Medicine | Admitting: Emergency Medicine

## 2021-08-03 ENCOUNTER — Other Ambulatory Visit: Payer: Self-pay

## 2021-08-03 ENCOUNTER — Emergency Department (HOSPITAL_BASED_OUTPATIENT_CLINIC_OR_DEPARTMENT_OTHER): Payer: Medicare Other

## 2021-08-03 ENCOUNTER — Encounter (HOSPITAL_BASED_OUTPATIENT_CLINIC_OR_DEPARTMENT_OTHER): Payer: Self-pay

## 2021-08-03 DIAGNOSIS — I493 Ventricular premature depolarization: Secondary | ICD-10-CM

## 2021-08-03 DIAGNOSIS — Z79899 Other long term (current) drug therapy: Secondary | ICD-10-CM | POA: Diagnosis not present

## 2021-08-03 DIAGNOSIS — R002 Palpitations: Secondary | ICD-10-CM

## 2021-08-03 DIAGNOSIS — Y900 Blood alcohol level of less than 20 mg/100 ml: Secondary | ICD-10-CM | POA: Diagnosis not present

## 2021-08-03 DIAGNOSIS — Z7982 Long term (current) use of aspirin: Secondary | ICD-10-CM | POA: Diagnosis not present

## 2021-08-03 DIAGNOSIS — G4730 Sleep apnea, unspecified: Secondary | ICD-10-CM | POA: Diagnosis not present

## 2021-08-03 LAB — BASIC METABOLIC PANEL
Anion gap: 6 (ref 5–15)
BUN: 13 mg/dL (ref 8–23)
CO2: 24 mmol/L (ref 22–32)
Calcium: 8.5 mg/dL — ABNORMAL LOW (ref 8.9–10.3)
Chloride: 107 mmol/L (ref 98–111)
Creatinine, Ser: 0.77 mg/dL (ref 0.61–1.24)
GFR, Estimated: >60 mL/min (ref >60)
Glucose, Bld: 99 mg/dL (ref 70–99)
Potassium: 3.5 mmol/L (ref 3.5–5.1)
Sodium: 137 mmol/L (ref 135–145)

## 2021-08-03 LAB — CBC
HCT: 42.2 % (ref 39.0–52.0)
Hemoglobin: 15 g/dL (ref 13.0–17.0)
MCH: 34.2 pg — ABNORMAL HIGH (ref 26.0–34.0)
MCHC: 35.5 g/dL (ref 30.0–36.0)
MCV: 96.3 fL (ref 80.0–100.0)
Platelets: 313 10*3/uL (ref 150–400)
RBC: 4.38 MIL/uL (ref 4.22–5.81)
RDW: 11.9 % (ref 11.5–15.5)
WBC: 8.1 10*3/uL (ref 4.0–10.5)
nRBC: 0 % (ref 0.0–0.2)

## 2021-08-03 LAB — TROPONIN I (HIGH SENSITIVITY)
Troponin I (High Sensitivity): 9 ng/L (ref <18)
Troponin I (High Sensitivity): 9 ng/L (ref <18)
Troponin I (High Sensitivity): 10 ng/L (ref <18)

## 2021-08-03 LAB — ETHANOL: Alcohol, Ethyl (B): <10 mg/dL (ref <10)

## 2021-08-03 LAB — BRAIN NATRIURETIC PEPTIDE: B Natriuretic Peptide: 17.7 pg/mL (ref 0.0–100.0)

## 2021-08-03 LAB — D-DIMER, QUANTITATIVE: D-Dimer, Quant: <0.27 ug/mL-FEU (ref 0.00–0.50)

## 2021-08-03 LAB — TSH: TSH: 1.561 u[IU]/mL (ref 0.350–4.500)

## 2021-08-03 LAB — MAGNESIUM: Magnesium: 2 mg/dL (ref 1.7–2.4)

## 2021-08-03 MED ORDER — METOPROLOL SUCCINATE ER 25 MG PO TB24: 12.5000 mg | ORAL_TABLET | Freq: Every day | ORAL | 0 refills | Status: DC

## 2021-08-03 NOTE — ED Notes (Signed)
Pulse noted to decrease from 70's to 40's intermittently while in triage. EDP notified . Pt placed in room. Pt is ambulatory. Skin warm and dry. Denies nausea ?

## 2021-08-03 NOTE — ED Triage Notes (Addendum)
Pt reports mowing grass this afternoon. Once done started drinking beer.Pt felt flutters in chest and checked HR and it was in 40's. No SOB ?

## 2021-08-03 NOTE — ED Notes (Addendum)
Pt requesting to see nurse -- when this nurse entered room pt reports  cardiac monitor had been arlarming earlier and pt states he saw HR drop to 37; pt states at the same time he felt sensation of fluttering -- pt reports sensation lasting just a few seconds before it increased to rate in the  80s and back to down into 60s where it currently remains on monitor.  Pt denies any complaints at the moment.  After leaving room able to review event of couplets and occasional pvc that occurred approx 2100hrs - provider updated on patient report of fluttering with rate drop and shown print out of pvc and couplets  ?

## 2021-08-03 NOTE — ED Notes (Signed)
Hemolyzed labs and new labs ordered now collected and walked to lab; results pending  ?

## 2021-08-03 NOTE — ED Notes (Signed)
Pt agreeable with d/c plan as discussed by provider- this nurse has verbally reinforced d/c instructions and provided pt with written copy-pt acknowledges verbal understanding and denies any additional questions, concerns, needs- ambulatory independently at discharge with steady gait; no distress; vitals stable ?

## 2021-08-03 NOTE — ED Notes (Signed)
Pt lying awake and alert in bed; quiet and calm.  GCS 15.  Denies any cp/sob complaints.  RR even and unlabored on RA with symmetrical rise and fall of chest.  Continuous cardiac and pulse ox maintained.  Pt denies any immediate needs questions, concerns - will monitor for acute changes and maintain plan of care.   ?

## 2021-08-03 NOTE — ED Provider Notes (Signed)
?[facility] ?Provider Note ? ? ?CSN: [id number] ?Arrival date & time: 08/03/21  1814 ? ?  ? ?History ? ?Chief Complaint  ?Patient presents with  ? Chest Pain  ? ? ?Donald [name] is a 66 y.o. male. ? ?Patient with a history of hyperlipidemia presenting with palpitations and fluttering in his chest after mowing the grass this afternoon.  He states he felt well prior to this was mowing the grass for less than 1 hour.  After mowing the grass he began to feel fluttering and palpitations to the left side of his chest.  Contrary to triage note he denies chest pain.  Symptoms lasted about 30 minutes and have since improved.  He checked his pulse at home with a finger pulse oximeter and says it was as low as 41 and in the 50s as well.  Denies feeling any dizziness or lightheadedness.  Denies any shortness of breath, nausea, vomiting, diaphoresis, abdominal pain or recent illness. ?Reports being diagnosed with sleep apnea several years ago.  At that time he was having palpitations and a fast heart rate.  States compliance with his medications and has had no recent changes. ?Denies any change in his routine caffeine use.  Did have 1 beer today palpitations started prior to this.  Feels back to baseline now for ? ?The history is provided by the patient.  ?Chest Pain ?Associated symptoms: palpitations   ?Associated symptoms: no abdominal pain, no cough, no dizziness, no fatigue, no fever, no headache, no nausea, no shortness of breath, no vomiting and no weakness   ? ?  ? ?Home Medications ?Prior to Admission medications   ?Medication Sig Start Date End Date Taking? Authorizing Provider  ?aspirin EC 81 MG tablet Take 1 tablet (81 mg total) by mouth daily. 07/31/12   Rowe Clack, MD  ?atorvastatin (LIPITOR) 20 MG tablet Take 1 tablet (20 mg total) by mouth at bedtime. 10/03/20   Colon Branch, MD  ?cetirizine (ZYRTEC) 10 MG tablet Take 10 mg by mouth daily as needed.      [provider]   ?   ? ?Allergies    ?Patient has no known allergies.   ? ?Review of Systems   ?Review of Systems  ?Constitutional:  Negative for activity change, appetite change, fatigue and fever.  ?HENT:  Negative for congestion and rhinorrhea.   ?Respiratory:  Negative for cough, chest tightness and shortness of breath.   ?Cardiovascular:  Positive for palpitations. Negative for chest pain.  ?Gastrointestinal:  Negative for abdominal pain, nausea and vomiting.  ?Genitourinary:  Negative for dysuria and hematuria.  ?Musculoskeletal:  Negative for arthralgias and myalgias.  ?Skin:  Negative for wound.  ?Neurological:  Negative for dizziness, weakness and headaches.  ? all other systems are negative except as noted in the HPI and PMH.  ? ?Physical Exam ?Updated Vital Signs ?BP (!) 132/102 (BP Location: Left Arm)   Pulse 81   Temp 98.7 ?F (37.1 ?C)   Resp 20   Ht '6\' 2"'$  (1.88 m)   Wt 117 kg   SpO2 94%   BMI 33.13 kg/m?  ?Physical Exam ?Vitals and nursing note reviewed.  ?Constitutional:   ?   General: He is not in acute distress. ?   Appearance: He is well-developed.  ?HENT:  ?   Head: Normocephalic and atraumatic.  ?   Mouth/Throat:  ?   Pharynx: No oropharyngeal exudate.  ?Eyes:  ?   Conjunctiva/sclera: Conjunctivae normal.  ?  Pupils: Pupils are equal, round, and reactive to light.  ?Neck:  ?   Comments: No meningismus. ?Cardiovascular:  ?   Rate and Rhythm: Normal rate and regular rhythm.  ?   Heart sounds: Normal heart sounds. No murmur heard. ?Pulmonary:  ?   Effort: Pulmonary effort is normal. No respiratory distress.  ?   Breath sounds: Normal breath sounds.  ?Chest:  ?   Chest wall: No tenderness.  ?Abdominal:  ?   Palpations: Abdomen is soft.  ?   Tenderness: There is no abdominal tenderness. There is no guarding or rebound.  ?Musculoskeletal:     ?   General: No tenderness. Normal range of motion.  ?   Cervical back: Normal range of motion and neck supple.  ?Skin: ?   General: Skin is warm.  ?Neurological:  ?    Mental Status: He is alert and oriented to person, place, and time.  ?   Cranial Nerves: No cranial nerve deficit.  ?   Motor: No abnormal muscle tone.  ?   Coordination: Coordination normal.  ?   Comments:  5/5 strength throughout. CN 2-12 intact.Equal grip strength.   ?Psychiatric:     ?   Behavior: Behavior normal.  ? ? ?ED Results / Procedures / Treatments   ?Labs ?(all labs ordered are listed, but only abnormal results are displayed) ?Labs Reviewed  ?CBC - Abnormal; Notable for the following components:  ?    Result Value  ? MCH 34.2 (*)   ? All other components within normal limits  ?BASIC METABOLIC PANEL - Abnormal; Notable for the following components:  ? Calcium 8.5 (*)   ? All other components within normal limits  ?BRAIN NATRIURETIC PEPTIDE  ?D-DIMER, QUANTITATIVE  ?MAGNESIUM  ?TSH  ?ETHANOL  ?TROPONIN I (HIGH SENSITIVITY)  ?TROPONIN I (HIGH SENSITIVITY)  ?TROPONIN I (HIGH SENSITIVITY)  ? ? ?EKG ?EKG Interpretation ? ?Date/Time:  Monday Aug 03 2021 18:28:02 EDT ?Ventricular Rate:  82 ?PR Interval:  158 ?QRS Duration: 84 ?QT Interval:  368 ?QTC Calculation: 429 ?R Axis:   60 ?Text Interpretation: Sinus rhythm with frequent Premature ventricular complexes Otherwise normal ECG When compared with ECG of 03-Aug-2021 18:26, PREVIOUS ECG IS PRESENT No significant change was found Confirmed by Ezequiel Essex [phone]) on 08/03/2021 7:00:07 PM ? ?Radiology ?DG Chest 2 View ? ?Result Date: 08/03/2021 ?CLINICAL DATA:  Chest flutter. EXAM: CHEST - 2 VIEW COMPARISON:  None. FINDINGS: The heart size and mediastinal contours are within normal limits. Both lungs are clear. The visualized skeletal structures are unremarkable. IMPRESSION: No active cardiopulmonary disease. Electronically Signed   By: Ronney Asters M.D.   On: 08/03/2021 19:16   ? ?Procedures ?Procedures  ? ? ?Medications Ordered in ED ?Medications - No data to display ? ?ED Course/ Medical Decision Making/ A&P ?  ?                        ?Medical Decision  Making ?Amount and/or Complexity of Data Reviewed ?Labs: ordered. ?Radiology: ordered. ?ECG/medicine tests: ordered. ? ?Risk ?Prescription drug management. ? ?Palpitations after mowing the grass.  No chest pain or shortness of breath.  EKG shows sinus rhythm with few PVCs. ? ?Heart rate in the 70s on assessment.  Denies feeling dizzy or lightheaded. ? ?Patient placed on monitor, labs will be obtained. ? ?Labs are reassuring.  Normal electrolytes.  TSH is pending.  Patient does have a few PVCs on monitor which she is symptomatic with.  Did go into.  Bigeminy for several seconds for which he felt palpitations. ? ?Results reviewed with cardiology Dr. Clayton Bibles.  He reviewed patient's EKGs.  He agrees likely symptomatic PVCs with bigeminy.  Needs to follow-up with his cardiologist for repeat Holter monitor testing.  May benefit from short course of low-dose beta-blocker. ? ?Remains stable in the ED. Occasional PVCs, no arrhythmias or high degree AV block seen. Troponin remains negative. Low suspicion for ACS.  ?Start low dose metoprolol. Followup with PCP and cardiologist. Cautioned that metoprolol may cause low blood pressure or dizziness.  ?Return to the ED with Exertional chest pain, shortness of breath, or any other concerns.  ? ? ? ? ? ? ? ?Final Clinical Impression(s) / ED Diagnoses ?Final diagnoses:  ?Palpitations  ?PVC's (premature ventricular contractions)  ? ? ?Rx / DC Orders ?ED Discharge Orders   ? ? None  ? ?  ? ? ?  ?Ezequiel Essex, MD ?08/04/21 0125 ? ?

## 2021-08-03 NOTE — Discharge Instructions (Signed)
Your testing is showing that you are having extra beats called PVCs.  You should follow-up with your cardiologist to have a Holter monitor.  Take the low-dose medication as prescribed to control your heart rate.  It may make you feel lightheaded and lower your blood pressure.  Return to the ED with chest pain, shortness of breath, any other concerns. ?

## 2023-01-14 NOTE — Progress Notes (Signed)
Cardiology Office Note:    Date:  01/19/2023   ID:  Donald [name], DOB 1955/09/28, MRN [id number]  PCP:  Vivien Presto, MD   Donald [name] Cardiologist:  None     Referring MD: Vivien Presto, MD   Chief Complaint  Patient presents with   Palpitations    History of Present Illness:    Donald [name] is a 67 y.o. male is seen at the request of Dr Salvadore Farber for evaluation of PVCs. He has a history of mixed HLD, prediabetes. He was seen in 2022 for palpitations. Seen by Dr Karren Burly with [facility]. Echo was normal. Holter monitor done- unable to see results. Patient reports it was OK. Last year seen by Dr Fransisco Beau with [facility] cardiology. Had a stress Echo that was normal. Holter showed frequent PVCs with burden of 6.8%. He was placed on Toprol XL 25 mg daily. He took this for about a year but then quit taking it and noted he felt much better. Really only notes palpitations when he is under a lot of stress. He does have OSA and uses CPAP religiously. Denies any chest pain, dyspnea, syncope or dizziness. Is trying to lose weight and just started going back to gym. Overall feels well.   Past Medical History:  Diagnosis Date   Blepharitis    Cellulitis 04/05/2013   Eyelid, Salomon Mast   ED (erectile dysfunction) 10/08/2011   Following nerve sparing robotic prostatectomy he has decreased tumescence    HNP (herniated nucleus pulposus)    lumbar spine   Hyperlipidemia    mild   Prostate cancer [facility])    Sleep apnea     Past Surgical History:  Procedure Laterality Date   BACK SURGERY     ~ 2005   LUMBAR DISC SURGERY     PROSTATECTOMY     robotic '08 at [facility]   TONSILLECTOMY      Current Medications: Current Meds  Medication Sig   aspirin EC 81 MG tablet Take 1 tablet (81 mg total) by mouth daily.     Allergies:   Patient has no known allergies.   Social History   Socioeconomic History   Marital status: Married    Spouse name: Not on file    Number of children: 0   Years of education: Not on file   Highest education level: Not on file  Occupational History   Occupation: cpa   Tobacco Use   Smoking status: Some Days    Types: Cigars   Smokeless tobacco: Never  Substance and Sexual Activity   Alcohol use: Yes    Alcohol/week: 6.0 standard drinks of alcohol    Types: 6 drink(s) per week    Comment: 2-3 drinks most days   Drug use: No   Sexual activity: Yes    Partners: Female  Other Topics Concern   Not on file  Social History Narrative   [employer], IT trainer.    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    Oldest step dtr died of suicide.    Marriage in good health.   Social Determinants of Health   Financial Resource Strain: Low Risk  (08/16/2022)   Received from [facility], [facility]   Overall Financial Resource Strain (CARDIA)    Difficulty of Paying Living Expenses: Not hard at all  Food Insecurity: No Food Insecurity (08/16/2022)   [facility]   Hunger Vital Sign    Worried About Running Out of Food in the Last Year: Never true    Ran Out of Food in the Last Year: Never true  Transportation Needs: No Transportation Needs (08/16/2022)   Received from [facility], [facility]   PRAPARE - Transportation    Lack of Transportation (Medical): No    Lack of Transportation (Non-Medical): No  Physical Activity: Insufficiently Active (08/16/2022)   Received from [facility], [facility]   Exercise Vital Sign    Days of Exercise per Week: 3 days    Minutes of Exercise per Session: 30 min  Stress: No Stress Concern Present (08/16/2022)   Received from [facility], [facility] - Occupational Stress Questionnaire    Feeling of Stress : Only a little  Social Connections: Socially Integrated (08/16/2022)   Received from [facility], [facility]    Social Network    How would you rate your social network (family, work, friends)?: Good participation with social networks     Family History: The patient's family history includes Alzheimer's disease in his mother; Coronary artery disease (age of onset: 71) in his sister; Diabetes in his paternal grandmother; Lymphoma in his brother; Obesity in his sister; Other in his father and sister; Prostate cancer in his brother; Stroke (age of onset: 110) in his father. There is no history of Colon cancer.  ROS:   Please see the history of present illness.     All other systems reviewed and are negative.  EKGs/Labs/Other Studies Reviewed:    The following studies were reviewed today: Echo 05/05/20: SUMMARY   Normal left ventricular size, wall thickness, wall motion and systolic  function with ejection fraction 55-60%.  Left ventricular filling pattern is prolonged relaxation.  Grossly normal size and function.  The atria are normal in size.  There is no significant valvular stenosis or regurgitation.  There was insufficient TR detected to calculate RV systolic pressure.  IVC size was normal.  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   There is no comparison study available.   Holter monitor 09/14/21: mpression  The patient was monitored for a total of 3d, underlying rhythm is Sinus with Frequent PVCs. The minimum heart rate was 50 bpm; the maximum 102 bpm; the average 68 bpm. No Atrial fibrillation/Atrial flutter or high grade AV block or pathological pauses. 1 supraventricular episodes were found. Longest SVT Episode 3 beats, Fastest SVT 130 bpm There were a total of 57846 PVCs with 2 morphologies and 94 couplets. Overall PVC Burden at 6.41 % There were a total of 60 PSVCs with 1 morphologies and 0 couplets. Overall PSVC Burden at 0.02 % There is a total of 4 patient events associated with PVCs EKG Interpretation Date/Time:  Wednesday January 19 2023 14:55:44  EDT Ventricular Rate:  68 PR Interval:  158 QRS Duration:  96 QT Interval:  368 QTC Calculation: 391 R Axis:   32  Text Interpretation: Normal sinus rhythm Normal ECG When compared with ECG of 03-Aug-2021 21:53, No significant change since last tracing Confirmed by [place], Flordia Kassem [phone]) on 01/19/2023 3:05:39 PM   Stress Echo 10/08/21: Impression  Stress: The patient underwent standard stress protocol.Total exercise time was 6 minutes and 14 seconds. Estimated METs = 9.8.  and a peak blood pressure of 164/76.There was an appropriate heart rate and blood pressure response to exercise.The patient achieved a heart rate of 148 beats per minute  at maximum stress, 96% of the maximal age-predicted heart rate.   Stress ECG: ST segment changes occurred that did not meet ischemic criteria.  PVCs disappear with exercise.   Baseline echocardiogram shows normal LV cavity size and function, EF of 55 to 60%, no regional wall motion abnormality, normal RV size and function, no major valvular abnormalities.   Post a stress echocardiogram shows appropriate reduction of the LV cavity size with augmentation of LV cavity function, no regional wall motion abnormality.   The ECG and Echo portions of the stress study are concordant with no evidence of inducible myocardial ischemia.This study shows a low prognostic risk  Recent Labs: No results found for requested labs within last 365 days.  Recent Lipid Panel    Component Value Date/Time   CHOL 148 10/01/2020 0936   TRIG 290.0 (H) 10/01/2020 0936   HDL 33.30 (L) 10/01/2020 0936   CHOLHDL 4 10/01/2020 0936   VLDL 58.0 (H) 10/01/2020 0936   LDLCALC 100 (H) 05/21/2019 0803   LDLDIRECT 54.0 10/01/2020 0936   EKG Interpretation Date/Time:  Wednesday January 19 2023 14:55:44 EDT Ventricular Rate:  68 PR Interval:  158 QRS Duration:  96 QT Interval:  368 QTC Calculation: 391 R Axis:   32  Text Interpretation: Normal sinus rhythm Normal ECG When  compared with ECG of 03-Aug-2021 21:53, No significant change since last tracing Confirmed by [place], Judith Demps [phone]) on 01/19/2023 3:05:39 PM    Risk Assessment/Calculations:                Physical Exam:    VS:  BP 132/86   Pulse 76   Ht 6\' 2"  (1.88 m)   Wt 264 lb (119.7 kg)   SpO2 95%   BMI 33.90 kg/m     Wt Readings from Last 3 Encounters:  01/19/23 264 lb (119.7 kg)  08/03/21 258 lb (117 kg)  04/28/21 268 lb 8 oz (121.8 kg)     GEN:  Well nourished, overweight in no acute distress HEENT: Normal NECK: No JVD; No carotid bruits LYMPHATICS: No lymphadenopathy CARDIAC: RRR, no murmurs, rubs, gallops RESPIRATORY:  Clear to auscultation without rales, wheezing or rhonchi  ABDOMEN: Soft, non-tender, non-distended MUSCULOSKELETAL:  No edema; No deformity  SKIN: Warm and dry NEUROLOGIC:  Alert and oriented x 3 PSYCHIATRIC:  Normal affect   ASSESSMENT:    1. Hyperlipidemia, unspecified hyperlipidemia type   2. PVC's (premature ventricular contractions)   3. Prediabetes   4. Obstructive sleep apnea syndrome    PLAN:    In order of problems listed above:  PVCs. Frequent on prior Holter but minimally symptomatic. Prior ischemic work up with stress Echo normal. LV function is normal. Recommend he use metoprolol as needed if he is having a bad day with palpitations. Otherwise no therapy required.  HLD. Given family history encouraged him to stay on statin therapy. Work on lifestyle modification with heart healthy diet, weight loss and regular aerobic exercise. OSA on CPAP Prediabetes. Focus on lifestyle modification.            Medication Adjustments/Labs and Tests Ordered: Current medicines are reviewed at length with the patient today.  Concerns regarding medicines are outlined above.  Orders Placed This Encounter  Procedures   EKG 12-Lead   No orders of the defined types were placed in this encounter.   Patient Instructions  Medication Instructions:   None *If you need a refill on your cardiac medications before your next appointment, please call your pharmacy*  Lab Work: None If you have labs (blood work) drawn today and your tests are completely normal, you will receive your results only by: MyChart Message (if you have MyChart) OR A paper copy in the mail If you have any lab test that is abnormal or we need to change your treatment, we will call you to review the results.   Testing/Procedures: None   Follow-Up: At [facility], you and your health needs are our priority.  As part of our continuing mission to provide you with exceptional heart care, we have created designated Provider Care Teams.  These Care Teams include your primary Cardiologist (physician) and Advanced Practice [name] (APPs -  Physician Assistants and Nurse Practitioners) who all work together to provide you with the care you need, when you need it.   1 year(s)  Provider:   Dr.Cesare Sumlin      Signed, Dionisia Pacholski [place], MD  01/19/2023 3:50 PM    [facility]

## 2023-01-19 ENCOUNTER — Encounter: Payer: Self-pay | Admitting: Cardiology

## 2023-01-19 ENCOUNTER — Ambulatory Visit: Payer: Medicare Other | Attending: Cardiology | Admitting: Cardiology

## 2023-01-19 VITALS — BP 132/86 | HR 76 | Ht 74.0 in | Wt 264.0 lb

## 2023-01-19 DIAGNOSIS — G4733 Obstructive sleep apnea (adult) (pediatric): Secondary | ICD-10-CM | POA: Diagnosis not present

## 2023-01-19 DIAGNOSIS — I493 Ventricular premature depolarization: Secondary | ICD-10-CM | POA: Diagnosis not present

## 2023-01-19 DIAGNOSIS — E785 Hyperlipidemia, unspecified: Secondary | ICD-10-CM | POA: Diagnosis not present

## 2023-01-19 DIAGNOSIS — R7303 Prediabetes: Secondary | ICD-10-CM | POA: Diagnosis not present

## 2023-01-19 NOTE — Patient Instructions (Signed)
Medication Instructions:  None *If you need a refill on your cardiac medications before your next appointment, please call your pharmacy*   Lab Work: None If you have labs (blood work) drawn today and your tests are completely normal, you will receive your results only by: MyChart Message (if you have MyChart) OR A paper copy in the mail If you have any lab test that is abnormal or we need to change your treatment, we will call you to review the results.   Testing/Procedures: None   Follow-Up: At [facility], you and your health needs are our priority.  As part of our continuing mission to provide you with exceptional heart care, we have created designated Provider Care Teams.  These Care Teams include your primary Cardiologist (physician) and Advanced Practice Providers (APPs -  Physician Assistants and Nurse Practitioners) who all work together to provide you with the care you need, when you need it.   1 year(s)  Provider:   Dr.Jordan

## 2023-10-03 NOTE — Progress Notes (Unsigned)
 [facility] [address] [place] [address] Phone: [phone] Subjective:   LILLETTE Berwyn Posey, am serving as a scribe for Dr. Arthea Claudene.  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  Home exercises have been given previously.  Discussed core strengthening.  Gabapentin  200 mg at night.     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  Possibility of gouty deposits noted as well.  We discussed with patient in great length.  Discussed home exercises and icing regimen topical anti-inflammatories, nitroglycerin  protocol.  We discussed which activities to do which wants to avoid.  Discussed heel lift.  Follow-up again 4 to 6 weeks     Updated 10/05/2023 JAVION HOLMER is a 68 y.o. male coming in with complaint of L elbow pain over medial epicondyle. Pain with elbow flexion and wrist pronation. Patient has been doing push ups and trying to do as many as he can throughout the day. No pain with golfing. Unable to fully bend elbow.        Past Medical History:  Diagnosis Date   Blepharitis    Cellulitis 04/05/2013   Eyelid, Inocente Fairly   ED (erectile dysfunction) 10/08/2011   Following nerve sparing robotic prostatectomy he has decreased tumescence    HNP (herniated nucleus pulposus)    lumbar spine   Hyperlipidemia    mild   Prostate cancer (HCC)    Sleep apnea    Past Surgical History:  Procedure Laterality Date   BACK SURGERY     ~ 2005   LUMBAR DISC SURGERY     PROSTATECTOMY     robotic '08 at [facility]   TONSILLECTOMY     Social History   Socioeconomic History   Marital status: Married    Spouse name: Not on file   Number of children: 0   Years of education: Not on file   Highest education level: Not on file  Occupational History   Occupation: cpa   Tobacco Use   Smoking status: Some Days    Types:  Cigars   Smokeless tobacco: Never  Substance and Sexual Activity   Alcohol use: Yes    Alcohol/week: 6.0 standard drinks of alcohol    Types: 6 drink(s) per week    Comment: 2-3 drinks most days   Drug use: No   Sexual activity: Yes    Partners: Female  Other Topics Concern   Not on file  Social History Narrative   [employer]. [employer], IT trainer.    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    Oldest step dtr died of suicide.    Marriage in good health.   Social Drivers of Corporate investment banker Strain: Low Risk  (08/16/2022)   Received from [facility]   Overall Financial Resource Strain (CARDIA)    Difficulty of Paying Living Expenses: Not hard at all  Food Insecurity: No Food Insecurity (08/16/2022)   Received from [facility]   Hunger Vital Sign    Within the past 12 months, you worried that your food would run out before you got the money to buy more.: Never true    Within the past 12 months, the food you bought just didn't last and you didn't have money to get more.: Never true  Transportation Needs: No Transportation Needs (08/16/2022)   Received from [facility]   PRAPARE - Transportation    Lack of Transportation (Medical): No    Lack of Transportation (Non-Medical): No  Physical Activity: Insufficiently Active (08/16/2022)   Received from [facility]   Exercise Vital Sign    On average, how many days per week do you engage in moderate to strenuous exercise (like a brisk walk)?: 3 days    On average, how many minutes do you engage in exercise at this level?: 30 min  Stress: No Stress Concern Present (08/16/2022)   Received from [facility] Occup[facility] - Occupational Stress Questionnaire    Feeling of Stress : Only a little  Social Connections: Socially Integrated (08/16/2022)   Received from [facility]   Social Network    How would you rate your social  network (family, work, friends)?: Good participation with social networks   No Known Allergies Family History  Problem Relation Age of Onset   Stroke Father 28   Other Father        tobacco abuse   Alzheimer's disease Mother    Coronary artery disease Sister 69       MI and sudden deat age 59   Obesity Sister    Other Sister        tobacco abuse   Diabetes Paternal Grandmother    Prostate cancer Brother    Lymphoma Brother        ?? gastric   Colon cancer Neg Hx     Current Outpatient Medications (Endocrine & Metabolic):    predniSONE  (DELTASONE ) 20 MG tablet, Take 2 tablets (40 mg total) by mouth daily with breakfast.  Current Outpatient Medications (Cardiovascular):    atorvastatin  (LIPITOR) 20 MG tablet, Take 1 tablet (20 mg total) by mouth at bedtime.   metoprolol  succinate (TOPROL -XL) 25 MG 24 hr tablet, Take 0.5 tablets (12.5 mg total) by mouth daily.  Current Outpatient Medications (Respiratory):    cetirizine (ZYRTEC) 10 MG tablet, Take 10 mg by mouth daily as needed.    Current Outpatient Medications (Analgesics):    aspirin  EC 81 MG tablet, Take 1 tablet (81 mg total) by mouth daily.     Reviewed prior external information including notes and imaging from  primary care provider As well as notes that were available from care everywhere and other healthcare systems.  Past medical history, social, surgical and family history all reviewed in electronic medical record.  No pertanent information unless stated regarding to the chief complaint.   Review of Systems:  No headache, visual changes, nausea, vomiting, diarrhea, constipation, dizziness, abdominal pain, skin rash, fevers, chills, night sweats, weight loss, swollen lymph nodes, body aches, joint swelling, chest pain, shortness of breath, mood changes. POSITIVE muscle aches  Objective  Blood pressure 124/76, pulse 73, height 6' 2 (1.88 m), weight 270 lb (122.5 kg), SpO2 98%.   General: No apparent distress  alert and oriented x3 mood and affect normal, dressed appropriately.  HEENT: Pupils equal, extraocular movements intact  Respiratory: Patient's speak in full sentences and does not appear short of breath  Cardiovascular: No lower extremity edema, non tender, no erythema  Elbow exam shows patient does have tenderness to palpation more on the antecubital area of the elbow.  No masses palpated.  Good grip strength noted.  Able to push again and push out without significantly any weakness or pain.   Limited muscular skeletal ultrasound was performed and interpreted  by CLAUDENE HUSSAR, M  Limited ultrasound shows some hypertrophy of the pronator muscle.  And significant dilatation of the median nerve in this area.  Tender when put compression in the area.  Radial head appears to be unremarkable.    Impression and Recommendations:     The above documentation has been reviewed and is accurate and complete Theadora Noyes M Tydus Sanmiguel, DO

## 2023-10-05 ENCOUNTER — Ambulatory Visit: Admitting: Family Medicine

## 2023-10-05 ENCOUNTER — Other Ambulatory Visit: Payer: Self-pay

## 2023-10-05 VITALS — BP 124/76 | HR 73 | Ht 74.0 in | Wt 270.0 lb

## 2023-10-05 DIAGNOSIS — G5612 Other lesions of median nerve, left upper limb: Secondary | ICD-10-CM | POA: Diagnosis not present

## 2023-10-05 DIAGNOSIS — M25522 Pain in left elbow: Secondary | ICD-10-CM

## 2023-10-05 MED ORDER — PREDNISONE 20 MG PO TABS: 40.0000 mg | ORAL_TABLET | Freq: Every day | ORAL | 0 refills | Status: DC

## 2023-10-05 NOTE — Patient Instructions (Signed)
 Graston tool Exercises to stretch elbow Prednisone  40mg  for 5 days Avoid full flexion and full extension Start at 25% of the weight in 2 weeks See me in 2 months

## 2023-10-06 ENCOUNTER — Encounter: Payer: Self-pay | Admitting: Family Medicine

## 2023-10-06 DIAGNOSIS — G5612 Other lesions of median nerve, left upper limb: Secondary | ICD-10-CM | POA: Insufficient documentation

## 2023-10-06 NOTE — Assessment & Plan Note (Signed)
 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  Seems to be causing difficulty with the median nerve as well.  We discussed with patient about manual massage area, possible formal physical therapy, home exercises, icing regimen.  Avoiding certain repetitive activity.  Possible compression sleeve with activity.  Follow-up with me again in 6 to 8 weeks.  Worsening pain could consider the possibility of a hydrodissection of the nerve but hopefully will not be necessary.

## 2023-11-10 ENCOUNTER — Ambulatory Visit: Admitting: Family Medicine

## 2023-12-01 NOTE — Progress Notes (Signed)
 [facility] [address] [place] [address] Phone: [phone] Subjective:   Donald [name], am serving as a scribe for Dr. Arthea Claudene.  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  Seems to be causing difficulty with the median nerve as well.  We discussed with patient about manual massage area, possible formal physical therapy, home exercises, icing regimen.  Avoiding certain repetitive activity.  Possible compression sleeve with activity.  Follow-up with me again in 6 to 8 weeks.  Worsening pain could consider the possibility of a hydrodissection of the nerve but hopefully will not be necessary.     Updated 12/06/2023 Donald [name] is a 68 y.o. 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  Questionable median nerve involvement. Everything is about the same.       Past Medical History:  Diagnosis Date   Blepharitis    Cellulitis 04/05/2013   Eyelid, Inocente Fairly   ED (erectile dysfunction) 10/08/2011   Following nerve sparing robotic prostatectomy he has decreased tumescence    HNP (herniated nucleus pulposus)    lumbar spine   Hyperlipidemia    mild   Prostate cancer (HCC)    Sleep apnea    Past Surgical History:  Procedure Laterality Date   BACK SURGERY     ~ 2005   LUMBAR DISC SURGERY     PROSTATECTOMY     robotic '08 at [facility]   TONSILLECTOMY     Social History   Socioeconomic History   Marital status: Married    Spouse name: Not on file   Number of children: 0   Years of education: Not on file   Highest education level: Not on file  Occupational History   Occupation: cpa   Tobacco Use   Smoking status: Some Days    Types: Cigars   Smokeless tobacco: Never  Substance and Sexual Activity   Alcohol use: Yes    Alcohol/week: 6.0 standard drinks of alcohol     Types: 6 drink(s) per week    Comment: 2-3 drinks most days   Drug use: No   Sexual activity: Yes    Partners: Female  Other Topics Concern   Not on file  Social History Narrative   [employer]. [employer], IT trainer.    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    Oldest step dtr died of suicide.    Marriage in good health.   Social Drivers of Corporate investment banker Strain: Low Risk  (08/16/2022)   Received from [facility]   Overall Financial Resource Strain (CARDIA)    Difficulty of Paying Living Expenses: Not hard at all  Food Insecurity: No Food Insecurity (08/16/2022)   Received from [facility]   Hunger Vital Sign    Within the past 12 months, you worried that your food would run out before you got the money to buy more.: Never true    Within the past 12 months, the food you bought just didn't last and you didn't have money to get more.: Never true  Transportation Needs: No Transportation Needs (08/16/2022)   Received from [facility] - Transportation    Lack of Transportation (Medical): No    Lack of  Transportation (Non-Medical): No  Physical Activity: Insufficiently Active (08/16/2022)   Received from [facility]   Exercise Vital Sign    On average, how many days per week do you engage in moderate to strenuous exercise (like a brisk walk)?: 3 days    On average, how many minutes do you engage in exercise at this level?: 30 min  Stress: No Stress Concern Present (08/16/2022)   Received from [facility] - Occupational Stress Questionnaire    Feeling of Stress : Only a little  Social Connections: Socially Integrated (08/16/2022)   Received from [facility]   Social Network    How would you rate your social network (family, work, friends)?: Good participation with social networks   No Known Allergies Family History  Problem Relation Age  of Onset   Stroke Father 65   Other Father        tobacco abuse   Alzheimer's disease Mother    Coronary artery disease Sister 56       MI and sudden deat age 26   Obesity Sister    Other Sister        tobacco abuse   Diabetes Paternal Grandmother    Prostate cancer Brother    Lymphoma Brother        ?? gastric   Colon cancer Neg Hx     Current Outpatient Medications (Endocrine & Metabolic):    predniSONE  (DELTASONE ) 20 MG tablet, Take 2 tablets (40 mg total) by mouth daily with breakfast.  Current Outpatient Medications (Cardiovascular):    atorvastatin  (LIPITOR) 20 MG tablet, Take 1 tablet (20 mg total) by mouth at bedtime.   metoprolol  succinate (TOPROL -XL) 25 MG 24 hr tablet, Take 0.5 tablets (12.5 mg total) by mouth daily.  Current Outpatient Medications (Respiratory):    cetirizine (ZYRTEC) 10 MG tablet, Take 10 mg by mouth daily as needed.    Current Outpatient Medications (Analgesics):    aspirin  EC 81 MG tablet, Take 1 tablet (81 mg total) by mouth daily.     Reviewed prior external information including notes and imaging from  primary care provider As well as notes that were available from care everywhere and other healthcare systems.  Past medical history, social, surgical and family history all reviewed in electronic medical record.  No pertanent information unless stated regarding to the chief complaint.   Review of Systems:  No headache, visual changes, nausea, vomiting, diarrhea, constipation, dizziness, abdominal pain, skin rash, fevers, chills, night sweats, weight loss, swollen lymph nodes, body aches, joint swelling, chest pain, shortness of breath, mood changes. POSITIVE muscle aches  Objective  Blood pressure 122/70, pulse 72, height 6' 2 (1.88 m), weight 269 lb (122 kg), SpO2 96%.   General: No apparent distress alert and oriented x3 mood and affect normal, dressed appropriately.  HEENT: Pupils equal, extraocular movements intact  Respiratory:  Patient's speak in full sentences and does not appear short of breath  Cardiovascular: No lower extremity edema, non tender, no erythema  Forearm exam shows patient is found tender more over the lateral epicondylar area than anywhere else.  Pain with resisted extension.  No pain over the pronator or the anterior interosseous nerve.  Good grip strength noted.  Procedure: Real-time Ultrasound Guided Injection of left common extensor tendon Device: GE Logiq Q7 Ultrasound guided injection is preferred based studies that show increased duration, increased effect, greater accuracy, decreased procedural pain, increased response rate, and decreased cost with ultrasound guided versus  blind injection.  Verbal informed consent obtained.  Time-out conducted.  Noted no overlying erythema, induration, or other signs of local infection.  Skin prepped in a sterile fashion.  Local anesthesia: Topical Ethyl chloride.  With sterile technique and under real time ultrasound guidance: With a 25-gauge half inch needle injected with 0.5 cc of 0.5% Marcaine and 0.5 cc of Kenalog.  Per mL Completed without difficulty  Pain immediately resolved suggesting accurate placement of the medication.  Advised to call if fevers/chills, erythema, induration, drainage, or persistent bleeding.  Images saved Impression: Technically successful ultrasound guided injection.     Impression and Recommendations:    The above documentation has been reviewed and is accurate and complete Press Casale M Minta Fair, DO

## 2023-12-06 ENCOUNTER — Encounter: Payer: Self-pay | Admitting: Family Medicine

## 2023-12-06 ENCOUNTER — Other Ambulatory Visit: Payer: Self-pay

## 2023-12-06 ENCOUNTER — Ambulatory Visit: Admitting: Family Medicine

## 2023-12-06 VITALS — BP 122/70 | HR 72 | Ht 74.0 in | Wt 269.0 lb

## 2023-12-06 DIAGNOSIS — G5612 Other lesions of median nerve, left upper limb: Secondary | ICD-10-CM | POA: Diagnosis not present

## 2023-12-06 DIAGNOSIS — M7712 Lateral epicondylitis, left elbow: Secondary | ICD-10-CM

## 2023-12-06 DIAGNOSIS — M25522 Pain in left elbow: Secondary | ICD-10-CM

## 2023-12-06 NOTE — Assessment & Plan Note (Signed)
 Seems resolved but unfortunately started having more of the lateral epicondylitis.  Given different exercises.  Discussed avoiding overhand lifting.  Discussed icing regimen.  Follow-up again in 2 months

## 2023-12-06 NOTE — Patient Instructions (Signed)
 Injection in elbow today Good to see you! Avoid overhand lifting Ice the area Any redness seek medical attention See you again in 2-3 months

## 2023-12-06 NOTE — Assessment & Plan Note (Signed)
 Injection given today and tolerated the procedure well, discussed icing regimen and home exercises, discussed avoiding certain activities.  Increase activity slowly.  Follow-up again in 6 to 8 weeks

## 2024-02-06 ENCOUNTER — Ambulatory Visit: Admitting: Family Medicine

## 2024-04-16 NOTE — Progress Notes (Unsigned)
 " Cardiology Office Note:    Date:  04/20/2024   ID:  Donald [name], DOB 1955/07/05, MRN [id number]  PCP:  Bobbette Coye LABOR, MD   [facility] Providers Cardiologist:  None     Referring MD: Bobbette Coye LABOR, MD   Chief Complaint  Patient presents with   Palpitations    History of Present Illness:    Donald [name] is a 69 y.o. male is seen for clearance for colonoscopy. He has a history of PVCs. He has a history of mixed HLD, prediabetes. He was seen in 2022 for palpitations. Seen by Dr Arne with [facility]. Echo was normal. Holter monitor done- unable to see results. Patient reports it was OK. Last year seen by Dr Uvaldo with [facility] cardiology. Had a stress Echo that was normal. Holter showed frequent PVCs with burden of 6.8%. He was placed on Toprol  XL 25 mg daily. He took this for about a year but then quit taking it and noted he felt much better. Really has very rare palpitations.  He does have OSA and uses CPAP religiously. Denies any chest pain, dyspnea, syncope or dizziness. He has lost weight. No longer taking lipitor  Past Medical History:  Diagnosis Date   Blepharitis    Cellulitis 04/05/2013   Eyelid, Inocente Fairly   ED (erectile dysfunction) 10/08/2011   Following nerve sparing robotic prostatectomy he has decreased tumescence    HNP (herniated nucleus pulposus)    lumbar spine   Hyperlipidemia    mild   Prostate cancer [facility])    Sleep apnea     Past Surgical History:  Procedure Laterality Date   BACK SURGERY     ~ 2005   LUMBAR DISC SURGERY     PROSTATECTOMY     robotic '08 at [facility]   TONSILLECTOMY      Current Medications: Current Meds  Medication Sig   aspirin  EC 81 MG tablet Take 1 tablet (81 mg total) by mouth daily.   cetirizine (ZYRTEC) 10 MG tablet Take 10 mg by mouth daily as needed.       Allergies:   Patient has no known allergies.   Social History   Socioeconomic History   Marital status: Married    Spouse name: Not  on file   Number of children: 0   Years of education: Not on file   Highest education level: Not on file  Occupational History   Occupation: cpa   Tobacco Use   Smoking status: Some Days    Types: Cigars   Smokeless tobacco: Never  Substance and Sexual Activity   Alcohol use: Yes    Alcohol/week: 6.0 standard drinks of alcohol    Types: 6 drink(s) per week    Comment: 2-3 drinks most days   Drug use: No   Sexual activity: Yes    Partners: Female  Other Topics Concern   Not on file  Social History Narrative   [employer]. [employer], IT TRAINER.    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    Oldest step dtr died of suicide.    Marriage in good health.   Social Drivers of Health   Tobacco Use: High Risk (04/20/2024)   Patient History    Smoking Tobacco Use: Some Days    Smokeless Tobacco Use: Never    Passive Exposure: Not on file  Financial Resource Strain: Low Risk (03/02/2024)   Received from [facility]   Overall Financial Resource Strain (CARDIA)    How hard is it for you to pay for the very basics like food, housing, medical care, and heating?: Not hard at all  Food Insecurity: No Food Insecurity (03/02/2024)   Received from [facility]   Epic    Within the past 12 months, you worried that your food would run out before you got the money to buy more.: Never true    Within the past 12 months, the food you bought just didn't last and you didn't have money to get more.: Never true  Transportation Needs: No Transportation Needs (03/02/2024)   Received from [facility]    In the past 12 months, has lack of transportation kept you from medical appointments or from getting medications?: No    In the past 12 months, has lack of transportation kept you from meetings, work, or from getting things needed for daily living?: No  Physical Activity: Unknown (03/02/2024)   Received from [facility]    Exercise Vital Sign    On average, how many days per week do you engage in moderate to strenuous exercise (like a brisk walk)?: 3 days    Minutes of Exercise per Session: Not on file  Stress: No Stress Concern Present (03/02/2024)   Received from [facility] - Occupational Stress Questionnaire    Do you feel stress - tense, restless, nervous, or anxious, or unable to sleep at night because your mind is troubled all the time - these days?: Not at all  Social Connections: Socially Integrated (03/02/2024)   Received from [facility]   Social Network    How would you rate your social network (family, work, friends)?: Good participation with social networks  Depression (PHQ2-9): Not on file  Alcohol Screen: Not on file  Housing: Low Risk (03/02/2024)   Received from [facility]    In the last 12 months, was there a time when you were not able to pay the mortgage or rent on time?: No    Number of Times Moved in the Last Year: 0    At any time in the past 12 months, were you homeless or living in a shelter (including now)?: No  Utilities: Not At Risk (03/02/2024)   Received from [facility]    In the past 12 months has the electric, gas, oil, or water company threatened to shut off services in your home?: No  Health Literacy: Not on file     Family History: The patient's family history includes Alzheimer's disease in his mother; Coronary artery disease (age of onset: 32) in his sister; Diabetes in his paternal grandmother; Lymphoma in his brother; Obesity in his sister; Other in his father and sister; Prostate cancer in his brother; Stroke (age of onset: 44) in his father. There is no history of Colon cancer.  ROS:   Please see the history of present illness.     All other systems reviewed and are negative.  EKGs/Labs/Other Studies Reviewed:    The following studies were reviewed today: Echo 05/05/20: SUMMARY   Normal left  ventricular size, wall thickness, wall motion and systolic  function with ejection fraction 55-60%.  Left ventricular filling pattern is prolonged relaxation.  Grossly normal size and function.  The atria are normal in size.  There is no significant valvular stenosis or regurgitation.  There was insufficient TR detected to calculate  RV systolic pressure.  IVC size was normal.  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   There is no comparison study available.   Holter monitor 09/14/21: mpression  The patient was monitored for a total of 3d, underlying rhythm is Sinus with Frequent PVCs. The minimum heart rate was 50 bpm; the maximum 102 bpm; the average 68 bpm. No Atrial fibrillation/Atrial flutter or high grade AV block or pathological pauses. 1 supraventricular episodes were found. Longest SVT Episode 3 beats, Fastest SVT 130 bpm There were a total of 80811 PVCs with 2 morphologies and 94 couplets. Overall PVC Burden at 6.41 % There were a total of 60 PSVCs with 1 morphologies and 0 couplets. Overall PSVC Burden at 0.02 % There is a total of 4 patient events associated with PVCs EKG Interpretation Date/Time:  Friday April 20 2024 11:01:41 EST Ventricular Rate:  63 PR Interval:  166 QRS Duration:  96 QT Interval:  396 QTC Calculation: 405 R Axis:   30  Text Interpretation: Normal sinus rhythm Normal ECG When compared with ECG of 19-Jan-2023 14:55, No significant change was found Confirmed by Riggins Cisek [id number]) on 04/20/2024 11:03:58 AM   Stress Echo 10/08/21: Impression  Stress: The patient underwent standard stress protocol.Total exercise time was 6 minutes and 14 seconds. Estimated METs = 9.8.  and a peak blood pressure of 164/76.There was an appropriate heart rate and blood pressure response to exercise.The patient achieved a heart rate of 148 beats per minute at maximum stress, 96% of the maximal age-predicted heart rate.   Stress ECG: ST segment changes  occurred that did not meet ischemic criteria.  PVCs disappear with exercise.   Baseline echocardiogram shows normal LV cavity size and function, EF of 55 to 60%, no regional wall motion abnormality, normal RV size and function, no major valvular abnormalities.   Post a stress echocardiogram shows appropriate reduction of the LV cavity size with augmentation of LV cavity function, no regional wall motion abnormality.   The ECG and Echo portions of the stress study are concordant with no evidence of inducible myocardial ischemia.This study shows a low prognostic risk  Recent Labs: No results found for requested labs within last 365 days.  Recent Lipid Panel    Component Value Date/Time   CHOL 148 10/01/2020 0936   TRIG 290.0 (H) 10/01/2020 0936   HDL 33.30 (L) 10/01/2020 0936   CHOLHDL 4 10/01/2020 0936   VLDL 58.0 (H) 10/01/2020 0936   LDLCALC 100 (H) 05/21/2019 0803   LDLDIRECT 54.0 10/01/2020 0936   EKG Interpretation Date/Time:  Friday April 20 2024 11:01:41 EST Ventricular Rate:  63 PR Interval:  166 QRS Duration:  96 QT Interval:  396 QTC Calculation: 405 R Axis:   30  Text Interpretation: Normal sinus rhythm Normal ECG When compared with ECG of 19-Jan-2023 14:55, No significant change was found Confirmed by Chaise Mahabir [phone]) on 04/20/2024 11:03:58 AM  EKG Interpretation Date/Time:  Friday April 20 2024 11:01:41 EST Ventricular Rate:  63 PR Interval:  166 QRS Duration:  96 QT Interval:  396 QTC Calculation: 405 R Axis:   30  Text Interpretation: Normal sinus rhythm Normal ECG When compared with ECG of 19-Jan-2023 14:55, No significant change was found Confirmed by Nathanyel Defenbaugh [phone]) on 04/20/2024 11:03:58 AM    Risk Assessment/Calculations:                Physical Exam:    VS:  BP 120/80 (BP Location: Left Arm, Patient  Position: Sitting, Cuff Size: Normal)   Pulse 63   Ht 6' 2 (1.88 m)   Wt 264 lb (119.7 kg)   BMI 33.90 kg/m     Wt Readings  from Last 3 Encounters:  04/20/24 264 lb (119.7 kg)  12/06/23 269 lb (122 kg)  10/05/23 270 lb (122.5 kg)     GEN:  Well nourished, overweight in no acute distress HEENT: Normal NECK: No JVD; No carotid bruits LYMPHATICS: No lymphadenopathy CARDIAC: RRR, no murmurs, rubs, gallops RESPIRATORY:  Clear to auscultation without rales, wheezing or rhonchi  ABDOMEN: Soft, non-tender, non-distended MUSCULOSKELETAL:  No edema; No deformity  SKIN: Warm and dry NEUROLOGIC:  Alert and oriented x 3 PSYCHIATRIC:  Normal affect   ASSESSMENT:    1. PVC (premature ventricular contraction)   2. PVC's (premature ventricular contractions)   3. Hyperlipidemia, unspecified hyperlipidemia type   4. Prediabetes     PLAN:    In order of problems listed above:  PVCs. Frequent on prior Holter but minimally symptomatic. Prior ischemic work up with stress Echo normal. LV function is normal. Since he has minimal symptoms no therapy required.  HLD. Mixed. Work on lifestyle modification with heart healthy diet, weight loss and regular aerobic exercise. OSA on CPAP Prediabetes. Focus on lifestyle modification.   He is cleared to proceed with colonoscopy. Can hold ASA for procedure  Will follow up PRN.           Medication Adjustments/Labs and Tests Ordered: Current medicines are reviewed at length with the patient today.  Concerns regarding medicines are outlined above.  Orders Placed This Encounter  Procedures   EKG 12-Lead   No orders of the defined types were placed in this encounter.   Patient Instructions  Medication Instructions:   *If you need a refill on your cardiac medications before your next appointment, please call your pharmacy*  Lab Work:    Testing/Procedures:   Follow-Up: At [facility], you and your health needs are our priority.  As part of our continuing mission to provide you with exceptional heart care, our providers are all part of one team.  This  team includes your primary Cardiologist (physician) and Advanced Practice Providers or APPs (Physician Assistants and Nurse Practitioners) who all work together to provide you with the care you need, when you need it.  Your next appointment:      Provider:      We recommend signing up for the patient portal called MyChart.  Sign up information is provided on this After Visit Summary.  MyChart is used to connect with patients for Virtual Visits (Telemedicine).  Patients are able to view lab/test results, encounter notes, upcoming appointments, etc.  Non-urgent messages can be sent to your provider as well.   To learn more about what you can do with MyChart, go to [email or web address].              Signed, Treson Laura, MD  04/20/2024 11:12 AM    [facility]  "

## 2024-04-20 ENCOUNTER — Ambulatory Visit: Attending: Cardiology | Admitting: Cardiology

## 2024-04-20 ENCOUNTER — Encounter: Payer: Self-pay | Admitting: Cardiology

## 2024-04-20 VITALS — BP 120/80 | HR 63 | Ht 74.0 in | Wt 264.0 lb

## 2024-04-20 DIAGNOSIS — E785 Hyperlipidemia, unspecified: Secondary | ICD-10-CM | POA: Diagnosis not present

## 2024-04-20 DIAGNOSIS — R7303 Prediabetes: Secondary | ICD-10-CM | POA: Diagnosis not present

## 2024-04-20 DIAGNOSIS — I493 Ventricular premature depolarization: Secondary | ICD-10-CM

## 2024-04-20 NOTE — Patient Instructions (Addendum)
